# Patient Record
Sex: Female | Born: 1965 | Race: White | Hispanic: No | Smoking: Former smoker
Health system: Southern US, Community
[De-identification: ages and names within clinical notes are randomized; demographics above are authoritative.]

## PROBLEM LIST (undated history)

## (undated) DIAGNOSIS — K649 Unspecified hemorrhoids: Secondary | ICD-10-CM

## (undated) DIAGNOSIS — G43909 Migraine, unspecified, not intractable, without status migrainosus: Secondary | ICD-10-CM

## (undated) DIAGNOSIS — M1990 Unspecified osteoarthritis, unspecified site: Secondary | ICD-10-CM

## (undated) DIAGNOSIS — K219 Gastro-esophageal reflux disease without esophagitis: Secondary | ICD-10-CM

## (undated) DIAGNOSIS — F419 Anxiety disorder, unspecified: Secondary | ICD-10-CM

## (undated) DIAGNOSIS — E785 Hyperlipidemia, unspecified: Secondary | ICD-10-CM

## (undated) DIAGNOSIS — F99 Mental disorder, not otherwise specified: Secondary | ICD-10-CM

## (undated) DIAGNOSIS — I739 Peripheral vascular disease, unspecified: Secondary | ICD-10-CM

## (undated) DIAGNOSIS — Z8489 Family history of other specified conditions: Secondary | ICD-10-CM

## (undated) DIAGNOSIS — D649 Anemia, unspecified: Secondary | ICD-10-CM

## (undated) DIAGNOSIS — F329 Major depressive disorder, single episode, unspecified: Secondary | ICD-10-CM

## (undated) DIAGNOSIS — J189 Pneumonia, unspecified organism: Secondary | ICD-10-CM

## (undated) DIAGNOSIS — I82409 Acute embolism and thrombosis of unspecified deep veins of unspecified lower extremity: Secondary | ICD-10-CM

## (undated) DIAGNOSIS — J45909 Unspecified asthma, uncomplicated: Secondary | ICD-10-CM

## (undated) DIAGNOSIS — M797 Fibromyalgia: Secondary | ICD-10-CM

## (undated) DIAGNOSIS — F32A Depression, unspecified: Secondary | ICD-10-CM

## (undated) DIAGNOSIS — IMO0002 Reserved for concepts with insufficient information to code with codable children: Secondary | ICD-10-CM

## (undated) DIAGNOSIS — R7309 Other abnormal glucose: Secondary | ICD-10-CM

## (undated) DIAGNOSIS — M940 Chondrocostal junction syndrome [Tietze]: Secondary | ICD-10-CM

## (undated) DIAGNOSIS — Z8719 Personal history of other diseases of the digestive system: Secondary | ICD-10-CM

## (undated) DIAGNOSIS — R51 Headache: Secondary | ICD-10-CM

## (undated) DIAGNOSIS — R091 Pleurisy: Secondary | ICD-10-CM

## (undated) DIAGNOSIS — M329 Systemic lupus erythematosus, unspecified: Secondary | ICD-10-CM

## (undated) HISTORY — PX: CARPAL TUNNEL RELEASE: SHX101

## (undated) HISTORY — PX: APPENDECTOMY: SHX54

## (undated) HISTORY — PX: ABDOMINAL HYSTERECTOMY: SHX81

## (undated) HISTORY — PX: FOOT SURGERY: SHX648

## (undated) HISTORY — PX: BACK SURGERY: SHX140

## (undated) HISTORY — PX: CERVICAL SPINE SURGERY: SHX589

## (undated) HISTORY — PX: TONSILLECTOMY: SUR1361

## (undated) HISTORY — PX: CHOLECYSTECTOMY: SHX55

## (undated) HISTORY — PX: TUBAL LIGATION: SHX77

---

## 1999-06-29 ENCOUNTER — Other Ambulatory Visit: Admission: RE | Admit: 1999-06-29 | Discharge: 1999-07-20 | Payer: Self-pay | Admitting: Emergency Medicine

## 2009-04-13 ENCOUNTER — Ambulatory Visit (HOSPITAL_COMMUNITY): Admission: RE | Admit: 2009-04-13 | Discharge: 2009-04-13 | Payer: Self-pay | Admitting: Neurosurgery

## 2009-05-26 ENCOUNTER — Encounter: Admission: RE | Admit: 2009-05-26 | Discharge: 2009-05-26 | Payer: Self-pay | Admitting: Neurosurgery

## 2009-06-16 ENCOUNTER — Encounter: Admission: RE | Admit: 2009-06-16 | Discharge: 2009-06-16 | Payer: Self-pay | Admitting: Neurosurgery

## 2009-07-24 ENCOUNTER — Encounter: Admission: RE | Admit: 2009-07-24 | Discharge: 2009-07-24 | Payer: Self-pay | Admitting: Neurosurgery

## 2009-08-12 ENCOUNTER — Ambulatory Visit (HOSPITAL_COMMUNITY): Admission: RE | Admit: 2009-08-12 | Discharge: 2009-08-13 | Payer: Self-pay | Admitting: Neurosurgery

## 2009-09-15 ENCOUNTER — Encounter: Admission: RE | Admit: 2009-09-15 | Discharge: 2009-09-15 | Payer: Self-pay | Admitting: Neurosurgery

## 2010-05-23 DIAGNOSIS — I82409 Acute embolism and thrombosis of unspecified deep veins of unspecified lower extremity: Secondary | ICD-10-CM

## 2010-05-23 HISTORY — DX: Acute embolism and thrombosis of unspecified deep veins of unspecified lower extremity: I82.409

## 2010-06-10 ENCOUNTER — Encounter
Admission: RE | Admit: 2010-06-10 | Discharge: 2010-06-10 | Payer: Self-pay | Source: Home / Self Care | Attending: Neurosurgery | Admitting: Neurosurgery

## 2010-08-16 LAB — CBC
MCHC: 34.1 g/dL (ref 30.0–36.0)
MCV: 92.1 fL (ref 78.0–100.0)
Platelets: 216 10*3/uL (ref 150–400)
WBC: 8.2 10*3/uL (ref 4.0–10.5)

## 2010-08-16 LAB — BASIC METABOLIC PANEL
BUN: 9 mg/dL (ref 6–23)
Calcium: 9.1 mg/dL (ref 8.4–10.5)
Chloride: 103 mEq/L (ref 96–112)
Creatinine, Ser: 0.67 mg/dL (ref 0.4–1.2)
GFR calc non Af Amer: >60 mL/min (ref >60)
Glucose, Bld: 99 mg/dL (ref 70–99)

## 2010-08-16 LAB — SURGICAL PCR SCREEN
MRSA, PCR: NEGATIVE
Staphylococcus aureus: POSITIVE — AB

## 2010-08-16 LAB — DIFFERENTIAL
Basophils Absolute: 0 10*3/uL (ref 0.0–0.1)
Eosinophils Absolute: 0.2 10*3/uL (ref 0.0–0.7)
Lymphocytes Relative: 21 % (ref 12–46)

## 2010-08-25 LAB — BASIC METABOLIC PANEL
BUN: 15 mg/dL (ref 6–23)
CO2: 26 mEq/L (ref 19–32)
Calcium: 9.2 mg/dL (ref 8.4–10.5)
Creatinine, Ser: 0.67 mg/dL (ref 0.4–1.2)
Glucose, Bld: 86 mg/dL (ref 70–99)
Sodium: 139 mEq/L (ref 135–145)

## 2010-08-25 LAB — CBC
MCHC: 34.9 g/dL (ref 30.0–36.0)
Platelets: 194 10*3/uL (ref 150–400)
RDW: 12.9 % (ref 11.5–15.5)
WBC: 8.6 10*3/uL (ref 4.0–10.5)

## 2010-08-26 IMAGING — CR DG CERVICAL SPINE 1V
1 series · 1 of 1 positions shown · non-contrast
Comparison: Intraoperative film from [DATE]

CLINICAL DATA: ACDF 08/12/2009.  Now with left hand numbness.

CERVICAL SPINE - 1 VIEW

[w c-spine lat]
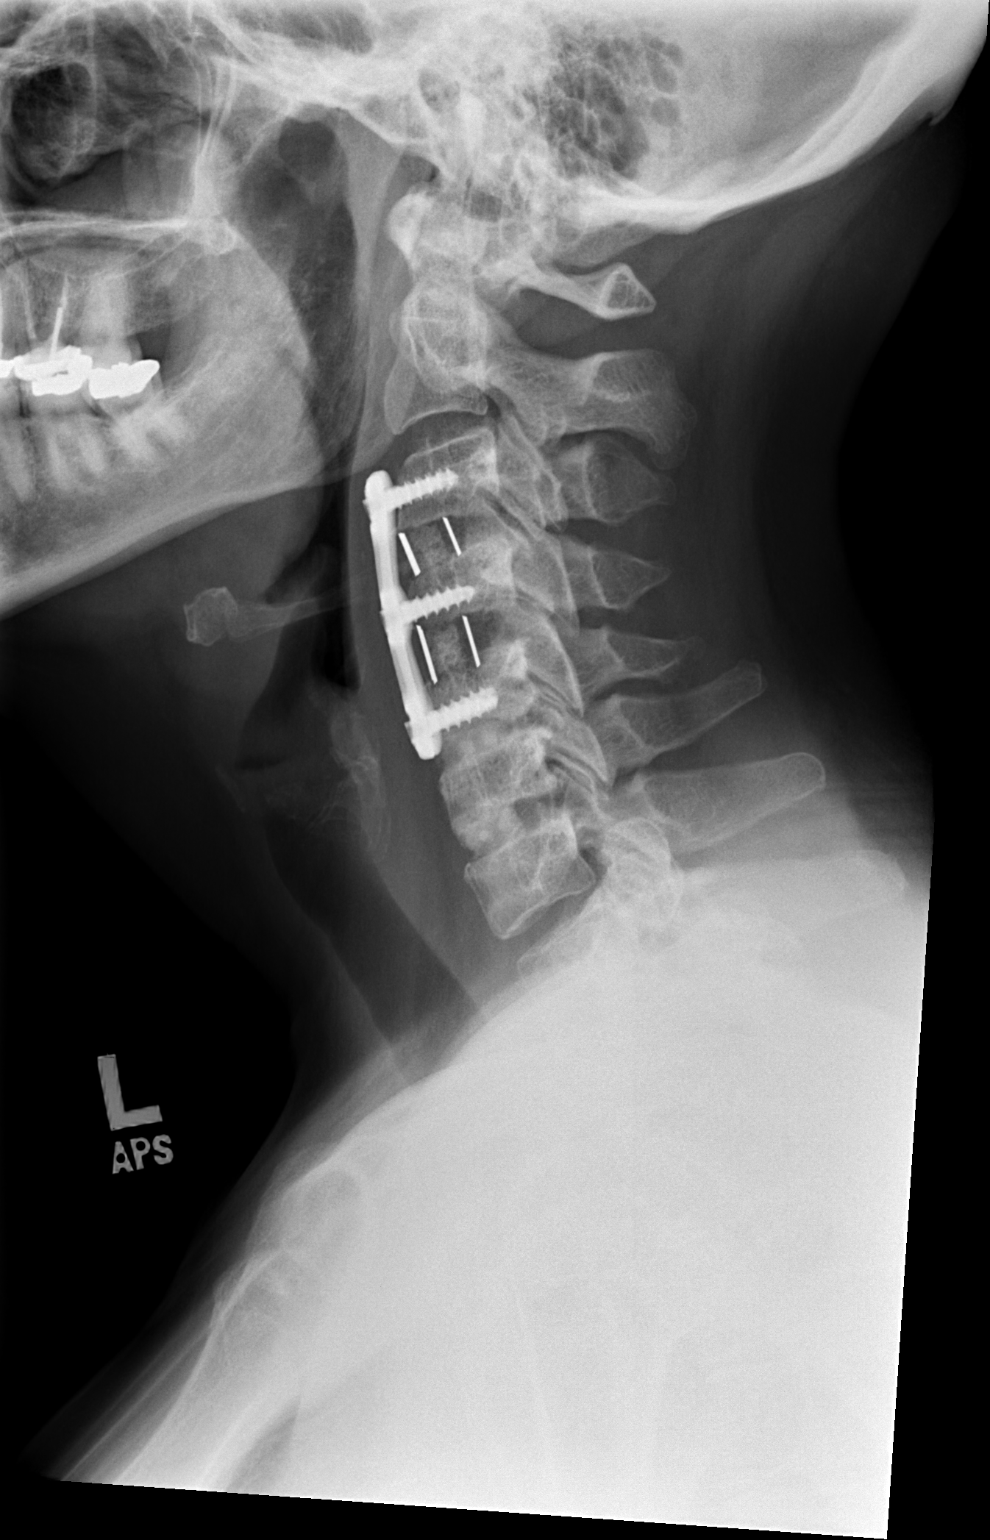

[1 of 1 positions shown; findings below may reference images not displayed]

FINDINGS: Satisfactory appearance status post C3-C5 ACDF with
anterior plating.  Unchanged position of the vertebral bodies and
interbody bone plugs.  Previous C5-6 and C6-7 fusions without
hardware.  Anatomic alignment.  No prevertebral soft tissue
swelling.
IMPRESSION: As above.  Unremarkable recent C3-C5 ACDF.

## 2013-05-27 ENCOUNTER — Other Ambulatory Visit: Payer: Self-pay | Admitting: Neurosurgery

## 2013-05-28 ENCOUNTER — Encounter (HOSPITAL_COMMUNITY): Payer: Self-pay

## 2013-05-28 ENCOUNTER — Encounter (HOSPITAL_COMMUNITY)
Admission: RE | Admit: 2013-05-28 | Discharge: 2013-05-28 | Disposition: A | Discharge status: Home / Self Care | Payer: BC Managed Care – PPO | Source: Ambulatory Visit | Attending: Neurosurgery | Admitting: Neurosurgery

## 2013-05-28 HISTORY — DX: Acute embolism and thrombosis of unspecified deep veins of unspecified lower extremity: I82.409

## 2013-05-28 HISTORY — DX: Anxiety disorder, unspecified: F41.9

## 2013-05-28 HISTORY — DX: Unspecified osteoarthritis, unspecified site: M19.90

## 2013-05-28 HISTORY — DX: Unspecified hemorrhoids: K64.9

## 2013-05-28 HISTORY — DX: Mental disorder, not otherwise specified: F99

## 2013-05-28 HISTORY — DX: Systemic lupus erythematosus, unspecified: M32.9

## 2013-05-28 HISTORY — DX: Family history of other specified conditions: Z84.89

## 2013-05-28 HISTORY — DX: Chondrocostal junction syndrome (tietze): M94.0

## 2013-05-28 HISTORY — DX: Reserved for concepts with insufficient information to code with codable children: IMO0002

## 2013-05-28 HISTORY — DX: Gastro-esophageal reflux disease without esophagitis: K21.9

## 2013-05-28 HISTORY — DX: Hyperlipidemia, unspecified: E78.5

## 2013-05-28 HISTORY — DX: Peripheral vascular disease, unspecified: I73.9

## 2013-05-28 HISTORY — DX: Unspecified asthma, uncomplicated: J45.909

## 2013-05-28 HISTORY — DX: Personal history of other diseases of the digestive system: Z87.19

## 2013-05-28 HISTORY — DX: Fibromyalgia: M79.7

## 2013-05-28 HISTORY — DX: Pleurisy: R09.1

## 2013-05-28 LAB — CBC
HCT: 41.5 % (ref 36.0–46.0)
Hemoglobin: 14 g/dL (ref 12.0–15.0)
MCH: 29.2 pg (ref 26.0–34.0)
MCHC: 33.7 g/dL (ref 30.0–36.0)
MCV: 86.6 fL (ref 78.0–100.0)
PLATELETS: 313 10*3/uL (ref 150–400)
RBC: 4.79 MIL/uL (ref 3.87–5.11)
RDW: 14 % (ref 11.5–15.5)
WBC: 9.5 10*3/uL (ref 4.0–10.5)

## 2013-05-28 LAB — BASIC METABOLIC PANEL
BUN: 7 mg/dL (ref 6–23)
CO2: 26 mEq/L (ref 19–32)
Calcium: 9.4 mg/dL (ref 8.4–10.5)
Chloride: 101 mEq/L (ref 96–112)
Creatinine, Ser: 0.61 mg/dL (ref 0.50–1.10)
GFR calc Af Amer: >90 mL/min (ref >90)
Glucose, Bld: 153 mg/dL — ABNORMAL HIGH (ref 70–99)
POTASSIUM: 3.6 meq/L — AB (ref 3.7–5.3)
SODIUM: 143 meq/L (ref 137–147)

## 2013-05-28 LAB — SURGICAL PCR SCREEN
MRSA, PCR: NEGATIVE
Staphylococcus aureus: POSITIVE — AB

## 2013-05-28 NOTE — Pre-Procedure Instructions (Addendum)
Doristine BosworthMichelle Goldston  05/28/2013   Your procedure is scheduled on: Wednesday, Januray 7.  Report [facility], Main Entrance/Entrance "A" at 6:30 AM.  Call this number if you have problems the morning of surgery: [phone]   Remember:   Do not eat food or drink liquids after midnight.   Take these medicines the morning of surgery with A SIP OF WATER: -Nexium, Cymbalta, Medrol. Take Xanax, Dilaudid, Soma if needed.    Do not wear jewelry, make-up or nail polish.  Do not wear lotions, powders, or perfumes. You may wear deodorant.  Do not shave 48 hours prior to surgery.   Do not bring valuables to the hospital.  [facility] is not responsible for any belongings or valuables.               Contacts, dentures or bridgework may not be worn into surgery.  Leave suitcase in the car. After surgery it may be brought to your room.  For patients admitted to the hospital, discharge time is determined by your treatment team.               Patients discharged the day of surgery will not be allowed to drive home.  Name and phone number of your driver: -   Special Instructions: Shower with CHG wash (Bactoshield) tonight and again in the am prior to arriving to hospital.   Please read over the following fact sheets that you were given: Pain Booklet, Coughing and Deep Breathing and Surgical Site Infection Prevention

## 2013-05-28 NOTE — Progress Notes (Signed)
Anesthesia Note:  RN notified me and anesthesiologist Dr. Jean RosenthalJackson about tachycardia.  Patient was having a lot of back and leg pain (crying), but no chest pain or SOB.  She reported a normal stress test at [facility] last year.  History, EKG, vitals reviewed by Dr. Jean RosenthalJackson and myself.  Patient is a first case tomorrow.  She will be evaluated by her assigned anesthesiologist on the day of surgery.  PAT RN to follow-up results from today and notify late anesthesiologist if any significant abnormalities.  HPR to fax copy of stress test before tomorrow morning.  Velna Ochsllison Jerrel Tiberio, PA-C [facility]/Anesthesiology Phone [phone] 05/28/2013 4:55 PM

## 2013-05-29 ENCOUNTER — Encounter (HOSPITAL_COMMUNITY): Payer: Self-pay | Admitting: Anesthesiology

## 2013-05-29 ENCOUNTER — Encounter (HOSPITAL_COMMUNITY)
Admission: RE | Disposition: A | Discharge status: Home / Self Care | Payer: Self-pay | Source: Ambulatory Visit | Attending: Neurosurgery

## 2013-05-29 ENCOUNTER — Ambulatory Visit (HOSPITAL_COMMUNITY): Payer: BC Managed Care – PPO | Admitting: Anesthesiology

## 2013-05-29 ENCOUNTER — Ambulatory Visit (HOSPITAL_COMMUNITY)
Admission: RE | Admit: 2013-05-29 | Discharge: 2013-05-29 | Disposition: A | Discharge status: Home / Self Care | Payer: BC Managed Care – PPO | Source: Ambulatory Visit | Attending: Neurosurgery | Admitting: Neurosurgery

## 2013-05-29 ENCOUNTER — Ambulatory Visit (HOSPITAL_COMMUNITY): Payer: BC Managed Care – PPO

## 2013-05-29 ENCOUNTER — Encounter (HOSPITAL_COMMUNITY): Payer: BC Managed Care – PPO | Admitting: Vascular Surgery

## 2013-05-29 DIAGNOSIS — I739 Peripheral vascular disease, unspecified: Secondary | ICD-10-CM | POA: Insufficient documentation

## 2013-05-29 DIAGNOSIS — Z01810 Encounter for preprocedural cardiovascular examination: Secondary | ICD-10-CM | POA: Insufficient documentation

## 2013-05-29 DIAGNOSIS — K219 Gastro-esophageal reflux disease without esophagitis: Secondary | ICD-10-CM | POA: Insufficient documentation

## 2013-05-29 DIAGNOSIS — J45909 Unspecified asthma, uncomplicated: Secondary | ICD-10-CM | POA: Insufficient documentation

## 2013-05-29 DIAGNOSIS — M940 Chondrocostal junction syndrome [Tietze]: Secondary | ICD-10-CM | POA: Insufficient documentation

## 2013-05-29 DIAGNOSIS — E785 Hyperlipidemia, unspecified: Secondary | ICD-10-CM | POA: Insufficient documentation

## 2013-05-29 DIAGNOSIS — F411 Generalized anxiety disorder: Secondary | ICD-10-CM | POA: Insufficient documentation

## 2013-05-29 DIAGNOSIS — M329 Systemic lupus erythematosus, unspecified: Secondary | ICD-10-CM | POA: Insufficient documentation

## 2013-05-29 DIAGNOSIS — Z86718 Personal history of other venous thrombosis and embolism: Secondary | ICD-10-CM | POA: Insufficient documentation

## 2013-05-29 DIAGNOSIS — M5126 Other intervertebral disc displacement, lumbar region: Secondary | ICD-10-CM | POA: Diagnosis present

## 2013-05-29 DIAGNOSIS — Z01812 Encounter for preprocedural laboratory examination: Secondary | ICD-10-CM | POA: Insufficient documentation

## 2013-05-29 DIAGNOSIS — Z87891 Personal history of nicotine dependence: Secondary | ICD-10-CM | POA: Insufficient documentation

## 2013-05-29 DIAGNOSIS — M129 Arthropathy, unspecified: Secondary | ICD-10-CM | POA: Insufficient documentation

## 2013-05-29 DIAGNOSIS — IMO0001 Reserved for inherently not codable concepts without codable children: Secondary | ICD-10-CM | POA: Insufficient documentation

## 2013-05-29 HISTORY — PX: LUMBAR LAMINECTOMY/DECOMPRESSION MICRODISCECTOMY: SHX5026

## 2013-05-29 SURGERY — LUMBAR LAMINECTOMY/DECOMPRESSION MICRODISCECTOMY 1 LEVEL
Anesthesia: General | Site: Back | Laterality: Right

## 2013-05-29 MED ORDER — SODIUM CHLORIDE 0.9 % IV SOLN: 250.0000 mL | INTRAVENOUS | Status: DC

## 2013-05-29 MED ORDER — PHENYLEPHRINE HCL 10 MG/ML IJ SOLN
INTRAMUSCULAR | Status: DC | PRN
  Administered 2013-05-29 (×2): 80 ug via INTRAVENOUS

## 2013-05-29 MED ORDER — ONDANSETRON HCL 4 MG PO TABS: 8.0000 mg | ORAL_TABLET | Freq: Three times a day (TID) | ORAL | Status: DC | PRN

## 2013-05-29 MED ORDER — MUPIROCIN 2 % EX OINT
TOPICAL_OINTMENT | Freq: Two times a day (BID) | CUTANEOUS | Status: DC
  Administered 2013-05-29: 08:00:00 via NASAL
  Filled 2013-05-29: qty 22

## 2013-05-29 MED ORDER — FENTANYL CITRATE 0.05 MG/ML IJ SOLN
INTRAMUSCULAR | Status: DC | PRN
  Administered 2013-05-29 (×6): 50 ug via INTRAVENOUS
  Administered 2013-05-29: 100 ug via INTRAVENOUS
  Administered 2013-05-29 (×2): 50 ug via INTRAVENOUS

## 2013-05-29 MED ORDER — ONDANSETRON HCL 4 MG/2ML IJ SOLN: 4.0000 mg | Freq: Once | INTRAMUSCULAR | Status: DC | PRN

## 2013-05-29 MED ORDER — HYDROMORPHONE HCL PF 1 MG/ML IJ SOLN
INTRAMUSCULAR | Status: AC
  Filled 2013-05-29: qty 1

## 2013-05-29 MED ORDER — ATORVASTATIN CALCIUM 10 MG PO TABS
10.0000 mg | ORAL_TABLET | Freq: Every day | ORAL | Status: DC
  Administered 2013-05-29: 10 mg via ORAL
  Filled 2013-05-29: qty 1

## 2013-05-29 MED ORDER — SODIUM CHLORIDE 0.9 % IJ SOLN
3.0000 mL | INTRAMUSCULAR | Status: DC | PRN
Start: 2013-05-29 — End: 2013-05-29

## 2013-05-29 MED ORDER — VANCOMYCIN HCL IN DEXTROSE 1-5 GM/200ML-% IV SOLN
1000.0000 mg | Freq: Once | INTRAVENOUS | Status: DC
  Filled 2013-05-29: qty 200

## 2013-05-29 MED ORDER — VANCOMYCIN HCL IN DEXTROSE 1-5 GM/200ML-% IV SOLN
INTRAVENOUS | Status: AC
  Administered 2013-05-29: 1000 mg via INTRAVENOUS
  Filled 2013-05-29: qty 200

## 2013-05-29 MED ORDER — HYDROMORPHONE HCL 2 MG PO TABS
4.0000 mg | ORAL_TABLET | ORAL | Status: DC | PRN
  Administered 2013-05-29: 4 mg via ORAL
  Filled 2013-05-29: qty 2

## 2013-05-29 MED ORDER — HYDROMORPHONE HCL PF 1 MG/ML IJ SOLN: 0.5000 mg | INTRAMUSCULAR | Status: DC | PRN

## 2013-05-29 MED ORDER — LACTATED RINGERS IV SOLN
INTRAVENOUS | Status: DC | PRN
  Administered 2013-05-29 (×2): via INTRAVENOUS

## 2013-05-29 MED ORDER — ALUM & MAG HYDROXIDE-SIMETH 200-200-20 MG/5ML PO SUSP: 30.0000 mL | Freq: Four times a day (QID) | ORAL | Status: DC | PRN

## 2013-05-29 MED ORDER — LIDOCAINE-EPINEPHRINE 1 %-1:100000 IJ SOLN
INTRAMUSCULAR | Status: DC | PRN
  Administered 2013-05-29: 10 mL

## 2013-05-29 MED ORDER — ALPRAZOLAM 0.5 MG PO TABS: 0.5000 mg | ORAL_TABLET | Freq: Two times a day (BID) | ORAL | Status: DC | PRN

## 2013-05-29 MED ORDER — 0.9 % SODIUM CHLORIDE (POUR BTL) OPTIME
TOPICAL | Status: DC | PRN
  Administered 2013-05-29: 1000 mL

## 2013-05-29 MED ORDER — HYDROMORPHONE HCL PF 1 MG/ML IJ SOLN
INTRAMUSCULAR | Status: AC
  Filled 2013-05-29: qty 2

## 2013-05-29 MED ORDER — ONDANSETRON HCL 4 MG/2ML IJ SOLN
INTRAMUSCULAR | Status: DC | PRN
Start: 2013-05-29 — End: 2013-05-29
  Administered 2013-05-29: 4 mg via INTRAVENOUS

## 2013-05-29 MED ORDER — ROCURONIUM BROMIDE 100 MG/10ML IV SOLN
INTRAVENOUS | Status: DC | PRN
  Administered 2013-05-29: 5 mg via INTRAVENOUS
  Administered 2013-05-29: 40 mg via INTRAVENOUS

## 2013-05-29 MED ORDER — ACETAMINOPHEN 650 MG RE SUPP: 650.0000 mg | RECTAL | Status: DC | PRN

## 2013-05-29 MED ORDER — MENTHOL 3 MG MT LOZG: 1.0000 | LOZENGE | OROMUCOSAL | Status: DC | PRN

## 2013-05-29 MED ORDER — PANTOPRAZOLE SODIUM 40 MG PO TBEC: 40.0000 mg | DELAYED_RELEASE_TABLET | Freq: Every day | ORAL | Status: DC

## 2013-05-29 MED ORDER — NEOSTIGMINE METHYLSULFATE 1 MG/ML IJ SOLN
INTRAMUSCULAR | Status: DC | PRN
  Administered 2013-05-29: 3 mg via INTRAVENOUS

## 2013-05-29 MED ORDER — OXYCODONE HCL 10 MG PO TABS: 10.0000 mg | ORAL_TABLET | ORAL | Status: DC | PRN

## 2013-05-29 MED ORDER — SODIUM CHLORIDE 0.9 % IJ SOLN
3.0000 mL | Freq: Two times a day (BID) | INTRAMUSCULAR | Status: DC
  Administered 2013-05-29: 3 mL via INTRAVENOUS

## 2013-05-29 MED ORDER — HYDROXYCHLOROQUINE SULFATE 200 MG PO TABS
200.0000 mg | ORAL_TABLET | Freq: Two times a day (BID) | ORAL | Status: DC
  Administered 2013-05-29: 200 mg via ORAL
  Filled 2013-05-29 (×2): qty 1

## 2013-05-29 MED ORDER — ACETAMINOPHEN 325 MG PO TABS
650.0000 mg | ORAL_TABLET | ORAL | Status: DC | PRN
Start: 2013-05-29 — End: 2013-05-29

## 2013-05-29 MED ORDER — SODIUM CHLORIDE 0.9 % IR SOLN
Status: DC | PRN
  Administered 2013-05-29: 09:00:00

## 2013-05-29 MED ORDER — PROPOFOL 10 MG/ML IV BOLUS
INTRAVENOUS | Status: DC | PRN
  Administered 2013-05-29: 200 mg via INTRAVENOUS

## 2013-05-29 MED ORDER — MIDAZOLAM HCL 5 MG/5ML IJ SOLN
INTRAMUSCULAR | Status: DC | PRN
  Administered 2013-05-29: 1 mg via INTRAVENOUS

## 2013-05-29 MED ORDER — DOCUSATE SODIUM 100 MG PO CAPS
100.0000 mg | ORAL_CAPSULE | Freq: Two times a day (BID) | ORAL | Status: DC
  Administered 2013-05-29: 100 mg via ORAL
  Filled 2013-05-29 (×2): qty 1

## 2013-05-29 MED ORDER — PENTOXIFYLLINE ER 400 MG PO TBCR
400.0000 mg | EXTENDED_RELEASE_TABLET | Freq: Two times a day (BID) | ORAL | Status: DC
  Administered 2013-05-29: 400 mg via ORAL
  Filled 2013-05-29 (×2): qty 1

## 2013-05-29 MED ORDER — MIDAZOLAM HCL 2 MG/2ML IJ SOLN
INTRAMUSCULAR | Status: AC
  Filled 2013-05-29: qty 2

## 2013-05-29 MED ORDER — THROMBIN 5000 UNITS EX SOLR
CUTANEOUS | Status: DC | PRN
  Administered 2013-05-29 (×2): 5000 [IU] via TOPICAL

## 2013-05-29 MED ORDER — MIDAZOLAM HCL 2 MG/2ML IJ SOLN
1.0000 mg | Freq: Once | INTRAMUSCULAR | Status: AC
  Administered 2013-05-29: 1 mg via INTRAVENOUS

## 2013-05-29 MED ORDER — LIDOCAINE HCL (CARDIAC) 20 MG/ML IV SOLN
INTRAVENOUS | Status: DC | PRN
  Administered 2013-05-29: 60 mg via INTRAVENOUS

## 2013-05-29 MED ORDER — COLCHICINE 0.6 MG PO TABS
0.6000 mg | ORAL_TABLET | Freq: Every day | ORAL | Status: DC
  Administered 2013-05-29: 0.6 mg via ORAL
  Filled 2013-05-29: qty 1

## 2013-05-29 MED ORDER — GLYCOPYRROLATE 0.2 MG/ML IJ SOLN
INTRAMUSCULAR | Status: DC | PRN
  Administered 2013-05-29: 0.4 mg via INTRAVENOUS

## 2013-05-29 MED ORDER — HYDROMORPHONE HCL PF 1 MG/ML IJ SOLN
0.2500 mg | INTRAMUSCULAR | Status: DC | PRN
  Administered 2013-05-29 (×4): 0.5 mg via INTRAVENOUS

## 2013-05-29 MED ORDER — PROPRANOLOL HCL 1 MG/ML IV SOLN
INTRAVENOUS | Status: DC | PRN
  Administered 2013-05-29: .25 mg via INTRAVENOUS

## 2013-05-29 MED ORDER — CYCLOBENZAPRINE HCL 10 MG PO TABS
10.0000 mg | ORAL_TABLET | Freq: Three times a day (TID) | ORAL | Status: DC | PRN
  Administered 2013-05-29: 10 mg via ORAL
  Filled 2013-05-29: qty 1

## 2013-05-29 MED ORDER — DULOXETINE HCL 60 MG PO CPEP
60.0000 mg | ORAL_CAPSULE | Freq: Two times a day (BID) | ORAL | Status: DC
Start: 2013-05-29 — End: 2013-05-29
  Administered 2013-05-29: 60 mg via ORAL
  Filled 2013-05-29 (×2): qty 1

## 2013-05-29 MED ORDER — CYCLOBENZAPRINE HCL 10 MG PO TABS
ORAL_TABLET | ORAL | Status: AC
  Filled 2013-05-29: qty 1

## 2013-05-29 MED ORDER — PREDNISONE 5 MG PO TABS
5.0000 mg | ORAL_TABLET | Freq: Every day | ORAL | Status: DC
  Filled 2013-05-29: qty 1

## 2013-05-29 MED ORDER — BUPIVACAINE HCL (PF) 0.25 % IJ SOLN
INTRAMUSCULAR | Status: DC | PRN
  Administered 2013-05-29: 10 mL

## 2013-05-29 MED ORDER — ONDANSETRON HCL 4 MG/2ML IJ SOLN: 4.0000 mg | INTRAMUSCULAR | Status: DC | PRN

## 2013-05-29 MED ORDER — CARISOPRODOL 350 MG PO TABS
350.0000 mg | ORAL_TABLET | Freq: Four times a day (QID) | ORAL | Status: DC
  Administered 2013-05-29: 350 mg via ORAL
  Filled 2013-05-29: qty 1

## 2013-05-29 MED ORDER — MUPIROCIN 2 % EX OINT
TOPICAL_OINTMENT | CUTANEOUS | Status: AC
  Filled 2013-05-29: qty 22

## 2013-05-29 MED ORDER — PHENOL 1.4 % MT LIQD: 1.0000 | OROMUCOSAL | Status: DC | PRN

## 2013-05-29 MED ORDER — ARTIFICIAL TEARS OP OINT
TOPICAL_OINTMENT | OPHTHALMIC | Status: DC | PRN
  Administered 2013-05-29: 1 via OPHTHALMIC

## 2013-05-29 MED ORDER — HEMOSTATIC AGENTS (NO CHARGE) OPTIME
TOPICAL | Status: DC | PRN
  Administered 2013-05-29: 1 via TOPICAL

## 2013-05-29 SURGICAL SUPPLY — 58 items
BAG DECANTER FOR FLEXI CONT (MISCELLANEOUS) ×2 IMPLANT
BENZOIN TINCTURE PRP APPL 2/3 (GAUZE/BANDAGES/DRESSINGS) ×2 IMPLANT
BLADE SURG 11 STRL SS (BLADE) ×2 IMPLANT
BLADE SURG ROTATE 9660 (MISCELLANEOUS) IMPLANT
BRUSH SCRUB EZ PLAIN DRY (MISCELLANEOUS) ×2 IMPLANT
BUR MATCHSTICK NEURO 3.0 LAGG (BURR) ×2 IMPLANT
BUR PRECISION FLUTE 6.0 (BURR) ×2 IMPLANT
CANISTER SUCT 3000ML (MISCELLANEOUS) ×2 IMPLANT
CONT SPEC 4OZ CLIKSEAL STRL BL (MISCELLANEOUS) ×2 IMPLANT
DECANTER SPIKE VIAL GLASS SM (MISCELLANEOUS) ×2 IMPLANT
DERMABOND ADHESIVE PROPEN (GAUZE/BANDAGES/DRESSINGS) ×1
DERMABOND ADVANCED (GAUZE/BANDAGES/DRESSINGS) ×1
DERMABOND ADVANCED .7 DNX12 (GAUZE/BANDAGES/DRESSINGS) ×1 IMPLANT
DERMABOND ADVANCED .7 DNX6 (GAUZE/BANDAGES/DRESSINGS) ×1 IMPLANT
DRAPE LAPAROTOMY 100X72X124 (DRAPES) ×2 IMPLANT
DRAPE MICROSCOPE ZEISS OPMI (DRAPES) ×2 IMPLANT
DRAPE POUCH INSTRU U-SHP 10X18 (DRAPES) ×2 IMPLANT
DRAPE PROXIMA HALF (DRAPES) IMPLANT
DRAPE SURG 17X23 STRL (DRAPES) ×2 IMPLANT
DRSG OPSITE 4X5.5 SM (GAUZE/BANDAGES/DRESSINGS) ×2 IMPLANT
DRSG OPSITE POSTOP 3X4 (GAUZE/BANDAGES/DRESSINGS) ×2 IMPLANT
DURAPREP 26ML APPLICATOR (WOUND CARE) ×2 IMPLANT
ELECT BLADE 4.0 EZ CLEAN MEGAD (MISCELLANEOUS) ×2
ELECT REM PT RETURN 9FT ADLT (ELECTROSURGICAL) ×2
ELECTRODE BLDE 4.0 EZ CLN MEGD (MISCELLANEOUS) ×1 IMPLANT
ELECTRODE REM PT RTRN 9FT ADLT (ELECTROSURGICAL) ×1 IMPLANT
GAUZE SPONGE 4X4 16PLY XRAY LF (GAUZE/BANDAGES/DRESSINGS) IMPLANT
GLOVE BIO SURGEON STRL SZ8 (GLOVE) ×2 IMPLANT
GLOVE BIOGEL PI IND STRL 7.5 (GLOVE) ×4 IMPLANT
GLOVE BIOGEL PI INDICATOR 7.5 (GLOVE) ×4
GLOVE EXAM NITRILE LRG STRL (GLOVE) IMPLANT
GLOVE EXAM NITRILE MD LF STRL (GLOVE) IMPLANT
GLOVE EXAM NITRILE XL STR (GLOVE) IMPLANT
GLOVE EXAM NITRILE XS STR PU (GLOVE) IMPLANT
GLOVE INDICATOR 8.5 STRL (GLOVE) ×2 IMPLANT
GLOVE SURG SS PI 7.5 STRL IVOR (GLOVE) ×8 IMPLANT
GOWN BRE IMP SLV AUR LG STRL (GOWN DISPOSABLE) ×2 IMPLANT
GOWN BRE IMP SLV AUR XL STRL (GOWN DISPOSABLE) ×4 IMPLANT
GOWN STRL REIN 2XL LVL4 (GOWN DISPOSABLE) IMPLANT
KIT BASIN OR (CUSTOM PROCEDURE TRAY) ×2 IMPLANT
KIT ROOM TURNOVER OR (KITS) ×2 IMPLANT
NEEDLE HYPO 22GX1.5 SAFETY (NEEDLE) ×2 IMPLANT
NEEDLE SPNL 22GX3.5 QUINCKE BK (NEEDLE) ×2 IMPLANT
NS IRRIG 1000ML POUR BTL (IV SOLUTION) ×2 IMPLANT
PACK LAMINECTOMY NEURO (CUSTOM PROCEDURE TRAY) ×2 IMPLANT
RUBBERBAND STERILE (MISCELLANEOUS) ×4 IMPLANT
SPONGE GAUZE 4X4 12PLY (GAUZE/BANDAGES/DRESSINGS) ×2 IMPLANT
SPONGE SURGIFOAM ABS GEL SZ50 (HEMOSTASIS) ×2 IMPLANT
STRIP CLOSURE SKIN 1/2X4 (GAUZE/BANDAGES/DRESSINGS) ×2 IMPLANT
SUT VIC AB 0 CT1 18XCR BRD8 (SUTURE) ×1 IMPLANT
SUT VIC AB 0 CT1 8-18 (SUTURE) ×1
SUT VIC AB 2-0 CT1 18 (SUTURE) ×2 IMPLANT
SUT VICRYL 4-0 PS2 18IN ABS (SUTURE) ×2 IMPLANT
SYR 20ML ECCENTRIC (SYRINGE) ×2 IMPLANT
TAPE STRIPS DRAPE STRL (GAUZE/BANDAGES/DRESSINGS) ×2 IMPLANT
TOWEL OR 17X24 6PK STRL BLUE (TOWEL DISPOSABLE) ×2 IMPLANT
TOWEL OR 17X26 10 PK STRL BLUE (TOWEL DISPOSABLE) ×2 IMPLANT
WATER STERILE IRR 1000ML POUR (IV SOLUTION) ×2 IMPLANT

## 2013-05-29 NOTE — Op Note (Signed)
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  EBL: Minimal  History of present illness: Patient is a very pleasant 48 year old him as a progress worsening back and right leg pain rating down posterior thigh calf outside of Monsel consistent with an S1 nerve root pattern workup revealed a very large recurrent disc herniation L5-S1 the right patient failed all forms of conservative treatment with steroids and narcotics and time and due to patient's failure conservative treatment imaging findings and progression of clinical syndrome I recommended laminectomy redo in my discectomy L5-S1 on the right I extensively reviewed the risks and benefits of the operation the patient as well as therapy course expectations about alternatives surgery and she understood and agreed to proceed forward.  Operative procedure: Patient brought into the or was induced and general anesthesia positioned prone on the Wilson frame her back was prepped and draped in routine sterile fashion her old incision was outlined and infiltrated with 10 cc lidocaine appendectomy a bullet coverage down the subcutaneous tissues subperiosteal dissections care lamina of and through the scar and L5-S1 the right interoperative x-ray confirmed an of case appropriate level so the plane to the scar tissue and residual lamina lamina of L5 medial facet complex and S1 was developed and the laminotomy was extended under biting of the medial facet complex inferior L5 superior S1 a lot of dictation of the dura and the S1 nerve root the microscope illumination the S1 nerve root was dissected off of the pedicle the S1 pedicles identify the margins superiorly to the scar tissue the lateral aspect  the disc spaces identified there was extensive and very large osteophyte coming off the inferior aspect of the medial facet complex tented against a ruptured disc herniation causing severe entrapment of the S1 nerve root. The spur was aggressively under been the disc spaces identified and epidural veins coagulated an annulotomy was made with lumbar scalpel several large free fragments were medially expressed and removed with pituitary rongeurs the nerve hook then disc space was entered and cleaned out with Epstein curettes pituitary rongeurs and a 2 mm Kerrison punch. At the and of the discectomy there was no further stenosis on thecal sac right S1 nerve root the foramen easily accepted a coronary.and hockey-stick the wounds and copiously irrigated meticulous hemostasis was maintained Gelfoam was overlaid top of the dura the muscle fascia approximately is with interrupted Vicryl and the skin was closed running 4 subcuticular benzoin and Steri-Strips were applied patient recovered in stable condition. At the end of case on it counts sponge counts were correct.

## 2013-05-29 NOTE — Transfer of Care (Signed)
Immediate Anesthesia Transfer of Care Note  Patient: Dana Bond  Procedure(s) Performed: Procedure(s) with comments: LUMBAR LAMINECTOMY/DECOMPRESSION MICRODISCECTOMY 1 LEVEL (Right) - LUMBAR LAMINECTOMY/DECOMPRESSION MICRODISCECTOMY 1 LEVEL  Patient Location: PACU  Anesthesia Type:General  Level of Consciousness: awake, alert  and oriented  Airway & Oxygen Therapy: Patient Spontanous Breathing and Patient connected to face mask oxygen  Post-op Assessment: Report given to PACU RN  Post vital signs: Reviewed and stable  Complications: No apparent anesthesia complications

## 2013-05-29 NOTE — Discharge Instructions (Signed)

## 2013-05-29 NOTE — Progress Notes (Signed)
Dr.Smith aware of pain with 4 of dilaudid , and versed 1 mg ordered

## 2013-05-29 NOTE — Progress Notes (Signed)
Spoke with Dr Katrinka BlazingSMith about pt still coing of pain, more dilaudid ordered

## 2013-05-29 NOTE — Plan of Care (Signed)
Problem: Consults Goal: Diagnosis - Spinal Surgery Outcome: Completed/Met Date Met:  05/29/13 Lumbar Laminectomy (Complex)

## 2013-05-29 NOTE — Preoperative (Signed)
Beta Blockers   Reason not to administer Beta Blockers:Not Applicable 

## 2013-05-29 NOTE — H&P (Signed)
Dana Bond is an 48 y.o. female.   Chief Complaint: Back and right leg pain HPI: Patient is a very pleasant 48 year old female is a progress worsening back and right lower 70 pain rating down the back of her leg back or calf in the outside bottom of foot consistent with an S1 nerve root pattern. This pain is been relenting is been refractory to 2 doses of steroids Dosepaks patient got and grossly worsen progressive more debilitated workup revealed a large recurrent disc herniation L5-S1 on the right I have extensively reviewed the risks and benefits of the operation with the patient as well as perioperative course expectations about alternatives surgery and she understood and agreed to proceed forward.  Past Medical History  Diagnosis Date  . Lupus   . Hyperlipemia   . Family history of anesthesia complication     Mother N/V  . Peripheral vascular disease     on Plental  . Anxiety   . Mental disorder   . Asthma     been a while  . GERD (gastroesophageal reflux disease)   . Hemorrhoid   . H/O hiatal hernia   . Fibromyalgia   . Arthritis   . DVT (deep venous thrombosis) 2012    post op  . Pleurisy   . Costochondritis     Past Surgical History  Procedure Laterality Date  . Abdominal hysterectomy      Partial  . Cholecystectomy    . Tonsillectomy      and adneodis  . Appendectomy    . Cervical spine surgery      ACDF x 2  . Back surgery      lower disectomy  . Foot surgery Right     nerve removal  . Carpal tunnel release Right     History reviewed. No pertinent family history. Social History:  reports that she has quit smoking. She does not have any smokeless tobacco history on file. She reports that she does not drink alcohol or use illicit drugs.  Allergies:  Allergies  Allergen Reactions  . Augmentin [Amoxicillin-Pot Clavulanate] Anaphylaxis  . Ciprofloxacin Anaphylaxis  . Lyrica [Pregabalin] Anaphylaxis  . Gabapentin Swelling  . Imuran [Azathioprine] Rash     Medications Prior to Admission  Medication Sig Dispense Refill  . ALPRAZolam (XANAX) 0.5 MG tablet Take 0.5 mg by mouth 2 (two) times daily as needed for anxiety.      Marland Kitchen atorvastatin (LIPITOR) 10 MG tablet Take 10 mg by mouth daily.      . carisoprodol (SOMA) 350 MG tablet Take 350 mg by mouth 3 (three) times daily as needed for muscle spasms.      . colchicine 0.6 MG tablet Take 0.6 mg by mouth daily.      . DULoxetine (CYMBALTA) 60 MG capsule Take 60 mg by mouth 2 (two) times daily.      Marland Kitchen esomeprazole (NEXIUM) 40 MG capsule Take 40 mg by mouth 2 (two) times daily before a meal.      . HYDROmorphone (DILAUDID) 4 MG tablet Take 4 mg by mouth every 4 (four) hours as needed for severe pain.      . hydroxychloroquine (PLAQUENIL) 200 MG tablet Take 200 mg by mouth 2 (two) times daily.      . methotrexate (50 MG/ML) 1 G injection Inject into the vein once.      . methylPREDNISolone (MEDROL DOSEPAK) 4 MG tablet Take 4 mg by mouth. follow package directions      .  ondansetron (ZOFRAN) 8 MG tablet Take 8 mg by mouth every 8 (eight) hours as needed for nausea or vomiting.      . pentoxifylline (TRENTAL) 400 MG CR tablet Take 400 mg by mouth 2 (two) times daily.      . predniSONE (DELTASONE) 5 MG tablet Take 5 mg by mouth daily with breakfast.        Results for orders placed during the hospital encounter of 05/28/13 (from the past 48 hour(s))  SURGICAL PCR SCREEN     Status: Abnormal   Collection Time    05/28/13  4:32 PM      Result Value Range   MRSA, PCR NEGATIVE  NEGATIVE   Staphylococcus aureus POSITIVE (*) NEGATIVE   Comment:            The Xpert SA Assay (FDA     approved for NASAL specimens     in patients over 15 years of age),     is one component of     a comprehensive surveillance     program.  Test performance has     been validated by [employer] for patients greater     than or equal to 32 year old.     It is not intended     to diagnose infection nor to     guide  or monitor treatment.  BASIC METABOLIC PANEL     Status: Abnormal   Collection Time    05/28/13  4:34 PM      Result Value Range   Sodium 143  137 - 147 mEq/L   Potassium 3.6 (*) 3.7 - 5.3 mEq/L   Chloride 101  96 - 112 mEq/L   CO2 26  19 - 32 mEq/L   Glucose, Bld 153 (*) 70 - 99 mg/dL   BUN 7  6 - 23 mg/dL   Creatinine, Ser 0.61  0.50 - 1.10 mg/dL   Calcium 9.4  8.4 - 10.5 mg/dL   GFR calc non Af Amer >90  >90 mL/min   GFR calc Af Amer >90  >90 mL/min   Comment: (NOTE)     The eGFR has been calculated using the CKD EPI equation.     This calculation has not been validated in all clinical situations.     eGFR's persistently <90 mL/min signify possible Chronic Kidney     Disease.  CBC     Status: None   Collection Time    05/28/13  4:34 PM      Result Value Range   WBC 9.5  4.0 - 10.5 K/uL   RBC 4.79  3.87 - 5.11 MIL/uL   Hemoglobin 14.0  12.0 - 15.0 g/dL   HCT 41.5  36.0 - 46.0 %   MCV 86.6  78.0 - 100.0 fL   MCH 29.2  26.0 - 34.0 pg   MCHC 33.7  30.0 - 36.0 g/dL   RDW 14.0  11.5 - 15.5 %   Platelets 313  150 - 400 K/uL   No results found.  Review of Systems  Constitutional: Negative.   HENT: Negative.   Eyes: Negative.   Respiratory: Negative.   Cardiovascular: Negative.   Gastrointestinal: Negative.   Genitourinary: Negative.   Musculoskeletal: Negative.   Skin: Negative.   Neurological: Negative.   Psychiatric/Behavioral: Negative.     There were no vitals taken for this visit. Physical Exam  Constitutional: She is oriented to person, place, and time. She appears well-developed.  HENT:  Head: Normocephalic.  Neck: Normal range of motion.  Respiratory: Effort normal.  GI: Soft. Bowel sounds are normal.  Musculoskeletal: Normal range of motion.  Neurological: She is alert and oriented to person, place, and time. She has normal strength. GCS eye subscore is 4. GCS verbal subscore is 5. GCS motor subscore is 6.  Reflex Scores:      Tricep reflexes are 2+ on  the right side and 2+ on the left side.      Bicep reflexes are 2+ on the right side and 2+ on the left side.      Brachioradialis reflexes are 2+ on the right side and 2+ on the left side.      Patellar reflexes are 2+ on the right side and 2+ on the left side.      Achilles reflexes are 2+ on the right side and 2+ on the left side. Strength is 5 out of 5 in her iliopsoas quads and she's gastrocs EHL the left looks to be the right lower extremity has some trace weakness in plantarflexion on toe walk  Skin: Skin is warm and dry.     Assessment/Plan 48 year old woman presents for right-sided L5-S1 laminectomy microdiscectomy  Andrian Urbach P 05/29/2013, 8:39 AM

## 2013-05-29 NOTE — Progress Notes (Signed)
Pt. Alert and oriented,follows simple instructions, denies pain. Incision area without swelling, redness or S/S of infection. Voiding adequate clear yellow urine. Moving all extremities well and vitals stable and documented. Patient discharged home with family.  Lumbar surgery notes instructions given to patient and family member for home safety and precautions. Pt. and family stated understanding of instructions given 

## 2013-05-29 NOTE — Discharge Summary (Signed)
  Physician Discharge Summary  Patient ID: Dana EdisMichelle C Bond MRN: [id number] DOB/AGE: 49/01/1966 49 y.o.  Admit date: 05/29/2013 Discharge date: 05/29/2013  Admission Diagnoses: Right S1 radiculopathy from recurrent ruptured disc L5-S1  Discharge Diagnoses: Same Active Problems:   HNP (herniated nucleus pulposus), lumbar   Discharged Condition: good  Hospital Course: Patient admitted hospital underwent a redo laminectomy microdiscectomy L5-S1 on the right postop patient did very well is ambulating and voiding on the floor tolerating regular diet stable for discharge.  Consults: Significant Diagnostic Studies: Treatments: Redo L5-S1 microdiscectomy on the right Discharge Exam: Blood pressure 108/50, pulse 113, temperature 97.9 F (36.6 C), resp. rate 20, SpO2 95.00%. Thank out of 5 wound clean and dry  Disposition: Home     Medication List         ALPRAZolam 0.5 MG tablet  Commonly known as:  XANAX  Take 0.5 mg by mouth 2 (two) times daily as needed for anxiety.     atorvastatin 10 MG tablet  Commonly known as:  LIPITOR  Take 10 mg by mouth daily.     carisoprodol 350 MG tablet  Commonly known as:  SOMA  Take 350 mg by mouth 3 (three) times daily as needed for muscle spasms.     colchicine 0.6 MG tablet  Take 0.6 mg by mouth daily.     DULoxetine 60 MG capsule  Commonly known as:  CYMBALTA  Take 60 mg by mouth 2 (two) times daily.     esomeprazole 40 MG capsule  Commonly known as:  NEXIUM  Take 40 mg by mouth 2 (two) times daily before a meal.     HYDROmorphone 4 MG tablet  Commonly known as:  DILAUDID  Take 4 mg by mouth every 4 (four) hours as needed for severe pain.     hydroxychloroquine 200 MG tablet  Commonly known as:  PLAQUENIL  Take 200 mg by mouth 2 (two) times daily.     methotrexate 1 G injection  Commonly known as:  50 mg/ml  Inject into the vein once.     methylPREDNISolone 4 MG tablet  Commonly known as:  MEDROL DOSEPAK  Take 4 mg by mouth.  follow package directions     ondansetron 8 MG tablet  Commonly known as:  ZOFRAN  Take 8 mg by mouth every 8 (eight) hours as needed for nausea or vomiting.     Oxycodone HCl 10 MG Tabs  Commonly known as:  ROXICODONE  Take 1 tablet (10 mg total) by mouth every 4 (four) hours as needed for severe pain.     pentoxifylline 400 MG CR tablet  Commonly known as:  TRENTAL  Take 400 mg by mouth 2 (two) times daily.     predniSONE 5 MG tablet  Commonly known as:  DELTASONE  Take 5 mg by mouth daily with breakfast.           Follow-up Information   [facility]Laquitta Dominski P, MD.   Specialty:  Neurosurgery   Contact information:   [address] [place] [address] [phone]       Signed: Pollyanna Levay P 05/29/2013, 5:56 PM

## 2013-05-29 NOTE — Anesthesia Preprocedure Evaluation (Signed)
Anesthesia Evaluation  Patient identified by MRN, date of birth, ID band Patient awake    Reviewed: Allergy & Precautions, H&P , NPO status , Patient's Chart, lab work & pertinent test results  Airway       Dental   Pulmonary asthma , former smoker,          Cardiovascular + Peripheral Vascular Disease     Neuro/Psych Anxiety  Neuromuscular disease    GI/Hepatic hiatal hernia, GERD-  ,  Endo/Other    Renal/GU      Musculoskeletal  (+) Fibromyalgia -  Abdominal   Peds  Hematology   Anesthesia Other Findings   Reproductive/Obstetrics                           Anesthesia Physical Anesthesia Plan  ASA: II  Anesthesia Plan: General   Post-op Pain Management:    Induction: Intravenous  Airway Management Planned: Oral ETT  Additional Equipment:   Intra-op Plan:   Post-operative Plan: Extubation in OR  Informed Consent: I have reviewed the patients History and Physical, chart, labs and discussed the procedure including the risks, benefits and alternatives for the proposed anesthesia with the patient or authorized representative who has indicated his/her understanding and acceptance.     Plan Discussed with:   Anesthesia Plan Comments:         Anesthesia Quick Evaluation

## 2013-05-29 NOTE — Progress Notes (Signed)
ANTIBIOTIC CONSULT NOTE - INITIAL  Pharmacy Consult for vancomycin Indication: surgical prophylaxis s/p neurosurgery  Allergies  Allergen Reactions  . Augmentin [Amoxicillin-Pot Clavulanate] Anaphylaxis  . Ciprofloxacin Anaphylaxis  . Lyrica [Pregabalin] Anaphylaxis  . Gabapentin Swelling  . Imuran [Azathioprine] Rash    Patient Measurements:   Adjusted Body Weight:   Vital Signs: Temp: 98.1 F (36.7 C) (01/07 1210) BP: 129/63 mmHg (01/07 1210) Pulse Rate: 101 (01/07 1210) Intake/Output from previous day:   Intake/Output from this shift: Total I/O In: 1440 [P.O.:240; I.V.:1200] Out: 50 [Blood:50]  Labs:  Recent Labs  05/28/13 1634  WBC 9.5  HGB 14.0  PLT 313  CREATININE 0.61   CrCl is unknown because there is no height on file for the current visit. No results found for this basename: VANCOTROUGH, Leodis Binet, VANCORANDOM, GENTTROUGH, GENTPEAK, GENTRANDOM, TOBRATROUGH, TOBRAPEAK, TOBRARND, AMIKACINPEAK, AMIKACINTROU, AMIKACIN,  in the last 72 hours   Microbiology: Recent Results (from the past 720 hour(s))  SURGICAL PCR SCREEN     Status: Abnormal   Collection Time    05/28/13  4:32 PM      Result Value Range Status   MRSA, PCR NEGATIVE  NEGATIVE Final   Staphylococcus aureus POSITIVE (*) NEGATIVE Final   Comment:            The Xpert SA Assay (FDA     approved for NASAL specimens     in patients over 41 years of age),     is one component of     a comprehensive surveillance     program.  Test performance has     been validated by [employer] for patients greater     than or equal to 21 year old.     It is not intended     to diagnose infection nor to     guide or monitor treatment.    Medical History: Past Medical History  Diagnosis Date  . Lupus   . Hyperlipemia   . Family history of anesthesia complication     Mother N/V  . Peripheral vascular disease     on Plental  . Anxiety   . Mental disorder   . Asthma     been a while  . GERD  (gastroesophageal reflux disease)   . Hemorrhoid   . H/O hiatal hernia   . Fibromyalgia   . Arthritis   . DVT (deep venous thrombosis) 2012    post op  . Pleurisy   . Costochondritis     Medications:  Scheduled:  . atorvastatin  10 mg Oral Daily  . carisoprodol  350 mg Oral QID  . colchicine  0.6 mg Oral Daily  . cyclobenzaprine      . docusate sodium  100 mg Oral BID  . DULoxetine  60 mg Oral BID  . HYDROmorphone      . HYDROmorphone      . HYDROmorphone      . hydroxychloroquine  200 mg Oral BID  . midazolam      . mupirocin ointment   Nasal BID  . [START ON 05/30/2013] pantoprazole  40 mg Oral Daily  . pentoxifylline  400 mg Oral BID  . [START ON 05/30/2013] predniSONE  5 mg Oral Q breakfast  . sodium chloride  3 mL Intravenous Q12H   Infusions:  . sodium chloride     Assessment: 48 yo female s/p neurosurgery will be put on vancomycin for surgical prophylaxis.  SCr 0.61. Wt 102.1  kg.  Patient received one dose of vancomycin 1g iv at 0854 today.  Per RN, patient doesn't have a drain.  Goal of Therapy:  Vancomycin trough level 15-20 mcg/ml  Plan:  1) Vancomycin 1g iv x1 at 2100 tonight.  Pharmacy will sign off.   Mayerly Kaman, Tsz-Yin 05/29/2013,12:29 PM

## 2013-05-29 NOTE — Progress Notes (Signed)
Subjective: Patient reports Patient continues to make progress Prempro been pain although still significantly debilitated still has some anxiety with regard to being discharged does not feel that she her husband to take care of home at this point.  Objective: Vital signs in last 24 hours: Temp:  [97.7 F (36.5 C)] 97.7 F (36.5 C) (01/06 1556) Pulse Rate:  [128-145] 128 (01/06 1635) Resp:  [20] 20 (01/06 1556) BP: (130)/(90) 130/90 mmHg (01/06 1556) SpO2:  [93 %] 93 % (01/06 1556) Weight:  [102.059 kg (225 lb)] 102.059 kg (225 lb) (01/06 1556)  Intake/Output from previous day:   Intake/Output this shift:    Awake alert oriented strength out of 5 wound clean and dry  Lab Results:  Recent Labs  05/28/13 1634  WBC 9.5  HGB 14.0  HCT 41.5  PLT 313   BMET  Recent Labs  05/28/13 1634  NA 143  K 3.6*  CL 101  CO2 26  GLUCOSE 153*  BUN 7  CREATININE 0.61  CALCIUM 9.4    Studies/Results: No results found.  Assessment/Plan:  continue to mobilize physical therapy still researching skilled nursing for discharge patient is ready when that becomes available as an acceptable institution.  LOS: 0 days     Lynsee Wands P 05/29/2013, 8:47 AM

## 2013-05-29 NOTE — Anesthesia Postprocedure Evaluation (Signed)
  Anesthesia Post-op Note  Patient: Dana Bond  Procedure(s) Performed: Procedure(s) with comments: LUMBAR LAMINECTOMY/DECOMPRESSION MICRODISCECTOMY 1 LEVEL (Right) - LUMBAR LAMINECTOMY/DECOMPRESSION MICRODISCECTOMY 1 LEVEL  Patient Location: PACU  Anesthesia Type:General  Level of Consciousness: awake, alert , oriented and patient cooperative  Airway and Oxygen Therapy: Patient Spontanous Breathing  Post-op Pain: moderate  Post-op Assessment: Post-op Vital signs reviewed, Patient's Cardiovascular Status Stable, Respiratory Function Stable, Patent Airway, No signs of Nausea or vomiting and Pain level controlled  Post-op Vital Signs: stable  Complications: No apparent anesthesia complications

## 2013-06-04 ENCOUNTER — Encounter (HOSPITAL_COMMUNITY): Payer: Self-pay | Admitting: Neurosurgery

## 2014-01-28 ENCOUNTER — Other Ambulatory Visit: Payer: Self-pay | Admitting: Neurosurgery

## 2014-01-31 ENCOUNTER — Encounter (HOSPITAL_COMMUNITY): Payer: Self-pay | Admitting: Pharmacy Technician

## 2014-01-31 NOTE — Pre-Procedure Instructions (Signed)
LEQUITA MEADOWCROFT  01/31/2014   Your procedure is scheduled on:  Monday, September 21st  Report to [facility] Admitting at 945 AM.  Call this number if you have problems the morning of surgery: [phone]   Remember:   Do not eat food or drink liquids after midnight.   Take these medicines the morning of surgery with A SIP OF WATER: nexium, prednisone, cymbalta, xanax if needed, zofran if needed, oxycodone if needed   Do not wear jewelry, make-up or nail polish.  Do not wear lotions, powders, or perfumes. You may wear deodorant.  Do not shave 48 hours prior to surgery. Men may shave face and neck.  Do not bring valuables to the hospital.  [facility] is not responsible  for any belongings or valuables.               Contacts, dentures or bridgework may not be worn into surgery.  Leave suitcase in the car. After surgery it may be brought to your room.  For patients admitted to the hospital, discharge time is determined by your treatment team.               Patients discharged the day of surgery will not be allowed to drive home.  Please read over the following fact sheets that you were given: Pain Booklet, Coughing and Deep Breathing, MRSA Information and Surgical Site Infection Prevention [facility] - Preparing for Surgery  Before surgery, you can play an important role.  Because skin is not sterile, your skin needs to be as free of germs as possible.  You can reduce the number of germs on you skin by washing with CHG (chlorahexidine gluconate) soap before surgery.  CHG is an antiseptic cleaner which kills germs and bonds with the skin to continue killing germs even after washing.  Please DO NOT use if you have an allergy to CHG or antibacterial soaps.  If your skin becomes reddened/irritated stop using the CHG and inform your nurse when you arrive at Short Stay.  Do not shave (including legs and underarms) for at least 48 hours prior to the first CHG shower.  You may shave  your face.  Please follow these instructions carefully:   1.  Shower with CHG Soap the night before surgery and the morning of Surgery.  2.  If you choose to wash your hair, wash your hair first as usual with your normal shampoo.  3.  After you shampoo, rinse your hair and body thoroughly to remove the shampoo.  4.  Use CHG as you would any other liquid soap.  You can apply CHG directly to the skin and wash gently with scrungie or a clean washcloth.  5.  Apply the CHG Soap to your body ONLY FROM THE NECK DOWN.  Do not use on open wounds or open sores.  Avoid contact with your eyes, ears, mouth and genitals (private parts).  Wash genitals (private parts) with your normal soap.  6.  Wash thoroughly, paying special attention to the area where your surgery will be performed.  7.  Thoroughly rinse your body with warm water from the neck down.  8.  DO NOT shower/wash with your normal soap after using and rinsing off the CHG Soap.  9.  Pat yourself dry with a clean towel.            10.  Wear clean pajamas.            11.  Place clean sheets on your bed the night of your first shower and do not sleep with pets.  Day of Surgery  Do not apply any lotions/deoderants the morning of surgery.  Please wear clean clothes to the hospital/surgery center.

## 2014-02-03 ENCOUNTER — Encounter (HOSPITAL_COMMUNITY): Payer: Self-pay

## 2014-02-03 ENCOUNTER — Encounter (HOSPITAL_COMMUNITY)
Admission: RE | Admit: 2014-02-03 | Discharge: 2014-02-03 | Disposition: A | Discharge status: Home / Self Care | Payer: BC Managed Care – PPO | Source: Ambulatory Visit | Attending: Anesthesiology | Admitting: Anesthesiology

## 2014-02-03 ENCOUNTER — Encounter (HOSPITAL_COMMUNITY)
Admission: RE | Admit: 2014-02-03 | Discharge: 2014-02-03 | Disposition: A | Discharge status: Home / Self Care | Payer: BC Managed Care – PPO | Source: Ambulatory Visit | Attending: Neurosurgery | Admitting: Neurosurgery

## 2014-02-03 DIAGNOSIS — Z981 Arthrodesis status: Secondary | ICD-10-CM | POA: Insufficient documentation

## 2014-02-03 DIAGNOSIS — Z01810 Encounter for preprocedural cardiovascular examination: Secondary | ICD-10-CM | POA: Insufficient documentation

## 2014-02-03 DIAGNOSIS — Z01812 Encounter for preprocedural laboratory examination: Secondary | ICD-10-CM | POA: Insufficient documentation

## 2014-02-03 DIAGNOSIS — IMO0002 Reserved for concepts with insufficient information to code with codable children: Secondary | ICD-10-CM | POA: Insufficient documentation

## 2014-02-03 HISTORY — DX: Major depressive disorder, single episode, unspecified: F32.9

## 2014-02-03 HISTORY — DX: Depression, unspecified: F32.A

## 2014-02-03 HISTORY — DX: Migraine, unspecified, not intractable, without status migrainosus: G43.909

## 2014-02-03 HISTORY — DX: Anemia, unspecified: D64.9

## 2014-02-03 HISTORY — DX: Headache: R51

## 2014-02-03 LAB — BASIC METABOLIC PANEL
Anion gap: 11 (ref 5–15)
BUN: 10 mg/dL (ref 6–23)
CALCIUM: 9.5 mg/dL (ref 8.4–10.5)
CO2: 27 meq/L (ref 19–32)
CREATININE: 0.67 mg/dL (ref 0.50–1.10)
Chloride: 100 mEq/L (ref 96–112)
GFR calc Af Amer: >90 mL/min (ref >90)
GLUCOSE: 107 mg/dL — AB (ref 70–99)
Potassium: 4.8 mEq/L (ref 3.7–5.3)
Sodium: 138 mEq/L (ref 137–147)

## 2014-02-03 LAB — CBC
HEMATOCRIT: 39.1 % (ref 36.0–46.0)
Hemoglobin: 12.7 g/dL (ref 12.0–15.0)
MCH: 27.9 pg (ref 26.0–34.0)
MCHC: 32.5 g/dL (ref 30.0–36.0)
MCV: 85.9 fL (ref 78.0–100.0)
PLATELETS: 337 10*3/uL (ref 150–400)
RBC: 4.55 MIL/uL (ref 3.87–5.11)
RDW: 13.9 % (ref 11.5–15.5)
WBC: 8.9 10*3/uL (ref 4.0–10.5)

## 2014-02-03 LAB — SURGICAL PCR SCREEN
MRSA, PCR: NEGATIVE
Staphylococcus aureus: POSITIVE — AB

## 2014-02-03 NOTE — Progress Notes (Signed)
Primary - [facility] - cheri booth PA No cardiologist ekg in epic from jan 2015 no other testing

## 2014-02-03 NOTE — Progress Notes (Signed)
Called patient and left message for positive nasal swab. Patient stated she had mupirocin from previous positive swab earlier this year and did not need new prescription called in.

## 2014-02-09 MED ORDER — SODIUM CHLORIDE 0.9 % IV SOLN
1500.0000 mg | INTRAVENOUS | Status: AC
  Administered 2014-02-10: 1500 mg via INTRAVENOUS
  Filled 2014-02-09: qty 1500

## 2014-02-09 MED ORDER — DEXAMETHASONE SODIUM PHOSPHATE 10 MG/ML IJ SOLN
10.0000 mg | INTRAMUSCULAR | Status: AC
  Administered 2014-02-10: 8 mg via INTRAVENOUS

## 2014-02-10 ENCOUNTER — Encounter (HOSPITAL_COMMUNITY): Payer: Self-pay | Admitting: Certified Registered Nurse Anesthetist

## 2014-02-10 ENCOUNTER — Inpatient Hospital Stay (HOSPITAL_COMMUNITY)
Admission: RE | Admit: 2014-02-10 | Discharge: 2014-02-12 | DRG: 473 | Disposition: A | Discharge status: Home / Self Care | Payer: BC Managed Care – PPO | Source: Ambulatory Visit | Attending: Neurosurgery | Admitting: Neurosurgery

## 2014-02-10 ENCOUNTER — Inpatient Hospital Stay (HOSPITAL_COMMUNITY): Payer: BC Managed Care – PPO

## 2014-02-10 ENCOUNTER — Encounter (HOSPITAL_COMMUNITY)
Admission: RE | Disposition: A | Discharge status: Home / Self Care | Payer: Self-pay | Source: Ambulatory Visit | Attending: Neurosurgery

## 2014-02-10 ENCOUNTER — Inpatient Hospital Stay (HOSPITAL_COMMUNITY): Payer: BC Managed Care – PPO | Admitting: Certified Registered Nurse Anesthetist

## 2014-02-10 ENCOUNTER — Encounter (HOSPITAL_COMMUNITY): Payer: BC Managed Care – PPO | Admitting: Certified Registered Nurse Anesthetist

## 2014-02-10 DIAGNOSIS — D649 Anemia, unspecified: Secondary | ICD-10-CM | POA: Diagnosis present

## 2014-02-10 DIAGNOSIS — M549 Dorsalgia, unspecified: Secondary | ICD-10-CM | POA: Diagnosis present

## 2014-02-10 DIAGNOSIS — M329 Systemic lupus erythematosus, unspecified: Secondary | ICD-10-CM | POA: Diagnosis present

## 2014-02-10 DIAGNOSIS — M79609 Pain in unspecified limb: Secondary | ICD-10-CM | POA: Diagnosis present

## 2014-02-10 DIAGNOSIS — IMO0001 Reserved for inherently not codable concepts without codable children: Secondary | ICD-10-CM | POA: Diagnosis present

## 2014-02-10 DIAGNOSIS — R51 Headache: Secondary | ICD-10-CM | POA: Diagnosis not present

## 2014-02-10 DIAGNOSIS — E785 Hyperlipidemia, unspecified: Secondary | ICD-10-CM | POA: Diagnosis present

## 2014-02-10 DIAGNOSIS — F411 Generalized anxiety disorder: Secondary | ICD-10-CM | POA: Diagnosis present

## 2014-02-10 DIAGNOSIS — I739 Peripheral vascular disease, unspecified: Secondary | ICD-10-CM | POA: Diagnosis present

## 2014-02-10 DIAGNOSIS — K219 Gastro-esophageal reflux disease without esophagitis: Secondary | ICD-10-CM | POA: Diagnosis present

## 2014-02-10 DIAGNOSIS — F3289 Other specified depressive episodes: Secondary | ICD-10-CM | POA: Diagnosis present

## 2014-02-10 DIAGNOSIS — Z87891 Personal history of nicotine dependence: Secondary | ICD-10-CM

## 2014-02-10 DIAGNOSIS — Z86718 Personal history of other venous thrombosis and embolism: Secondary | ICD-10-CM

## 2014-02-10 DIAGNOSIS — M5440 Lumbago with sciatica, unspecified side: Secondary | ICD-10-CM

## 2014-02-10 DIAGNOSIS — IMO0002 Reserved for concepts with insufficient information to code with codable children: Principal | ICD-10-CM | POA: Diagnosis present

## 2014-02-10 DIAGNOSIS — J45909 Unspecified asthma, uncomplicated: Secondary | ICD-10-CM | POA: Diagnosis present

## 2014-02-10 DIAGNOSIS — F329 Major depressive disorder, single episode, unspecified: Secondary | ICD-10-CM | POA: Diagnosis present

## 2014-02-10 HISTORY — PX: POSTERIOR CERVICAL FUSION/FORAMINOTOMY: SHX5038

## 2014-02-10 SURGERY — POSTERIOR CERVICAL FUSION/FORAMINOTOMY LEVEL 2
Anesthesia: General | Site: Spine Cervical

## 2014-02-10 MED ORDER — DIAZEPAM 5 MG/ML IJ SOLN
INTRAMUSCULAR | Status: AC
  Filled 2014-02-10: qty 2

## 2014-02-10 MED ORDER — COLCHICINE 0.6 MG PO TABS
0.6000 mg | ORAL_TABLET | Freq: Every day | ORAL | Status: DC
  Administered 2014-02-11 – 2014-02-12 (×2): 0.6 mg via ORAL
  Filled 2014-02-10 (×2): qty 1

## 2014-02-10 MED ORDER — OXYCODONE HCL 5 MG/5ML PO SOLN: 5.0000 mg | Freq: Once | ORAL | Status: DC | PRN

## 2014-02-10 MED ORDER — DIAZEPAM 5 MG PO TABS
10.0000 mg | ORAL_TABLET | Freq: Four times a day (QID) | ORAL | Status: DC | PRN
  Administered 2014-02-10 – 2014-02-12 (×7): 10 mg via ORAL
  Filled 2014-02-10 (×7): qty 2

## 2014-02-10 MED ORDER — FENTANYL CITRATE 0.05 MG/ML IJ SOLN
INTRAMUSCULAR | Status: AC
  Filled 2014-02-10: qty 5

## 2014-02-10 MED ORDER — MORPHINE SULFATE 2 MG/ML IJ SOLN
1.0000 mg | INTRAMUSCULAR | Status: DC | PRN
  Administered 2014-02-10 – 2014-02-12 (×7): 1 mg via INTRAVENOUS
  Filled 2014-02-10 (×7): qty 1

## 2014-02-10 MED ORDER — GLYCOPYRROLATE 0.2 MG/ML IJ SOLN
INTRAMUSCULAR | Status: AC
  Filled 2014-02-10: qty 4

## 2014-02-10 MED ORDER — OXYCODONE HCL 5 MG PO TABS: 5.0000 mg | ORAL_TABLET | Freq: Once | ORAL | Status: DC | PRN

## 2014-02-10 MED ORDER — VANCOMYCIN HCL 10 G IV SOLR
1250.0000 mg | Freq: Two times a day (BID) | INTRAVENOUS | Status: DC
  Administered 2014-02-10 – 2014-02-12 (×4): 1250 mg via INTRAVENOUS
  Filled 2014-02-10 (×5): qty 1250

## 2014-02-10 MED ORDER — PROMETHAZINE HCL 25 MG/ML IJ SOLN: 6.2500 mg | INTRAMUSCULAR | Status: DC | PRN

## 2014-02-10 MED ORDER — ONDANSETRON HCL 4 MG/2ML IJ SOLN
INTRAMUSCULAR | Status: AC
  Filled 2014-02-10: qty 2

## 2014-02-10 MED ORDER — MIDAZOLAM HCL 2 MG/2ML IJ SOLN
INTRAMUSCULAR | Status: AC
  Filled 2014-02-10: qty 2

## 2014-02-10 MED ORDER — SODIUM CHLORIDE 0.9 % IR SOLN
Status: DC | PRN
  Administered 2014-02-10: 13:00:00

## 2014-02-10 MED ORDER — LACTATED RINGERS IV SOLN
INTRAVENOUS | Status: DC
Start: 2014-02-10 — End: 2014-02-12
  Administered 2014-02-10: 10:00:00 via INTRAVENOUS

## 2014-02-10 MED ORDER — PANTOPRAZOLE SODIUM 40 MG PO TBEC
40.0000 mg | DELAYED_RELEASE_TABLET | Freq: Every day | ORAL | Status: DC
  Administered 2014-02-11 – 2014-02-12 (×2): 40 mg via ORAL
  Filled 2014-02-10 (×2): qty 1

## 2014-02-10 MED ORDER — VECURONIUM BROMIDE 10 MG IV SOLR
INTRAVENOUS | Status: DC | PRN
  Administered 2014-02-10: 2 mg via INTRAVENOUS
  Administered 2014-02-10: 1 mg via INTRAVENOUS

## 2014-02-10 MED ORDER — ONDANSETRON HCL 4 MG PO TABS: 8.0000 mg | ORAL_TABLET | Freq: Three times a day (TID) | ORAL | Status: DC | PRN

## 2014-02-10 MED ORDER — PROPOFOL 10 MG/ML IV BOLUS
INTRAVENOUS | Status: AC
  Filled 2014-02-10: qty 20

## 2014-02-10 MED ORDER — FENTANYL CITRATE 0.05 MG/ML IJ SOLN
INTRAMUSCULAR | Status: DC | PRN
  Administered 2014-02-10: 50 ug via INTRAVENOUS
  Administered 2014-02-10: 25 ug via INTRAVENOUS
  Administered 2014-02-10: 50 ug via INTRAVENOUS
  Administered 2014-02-10: 25 ug via INTRAVENOUS
  Administered 2014-02-10: 100 ug via INTRAVENOUS
  Administered 2014-02-10: 25 ug via INTRAVENOUS
  Administered 2014-02-10 (×3): 50 ug via INTRAVENOUS
  Administered 2014-02-10: 25 ug via INTRAVENOUS
  Administered 2014-02-10: 50 ug via INTRAVENOUS

## 2014-02-10 MED ORDER — LIDOCAINE HCL (CARDIAC) 20 MG/ML IV SOLN
INTRAVENOUS | Status: AC
  Filled 2014-02-10: qty 10

## 2014-02-10 MED ORDER — LACTATED RINGERS IV SOLN
INTRAVENOUS | Status: DC | PRN
  Administered 2014-02-10 (×2): via INTRAVENOUS

## 2014-02-10 MED ORDER — TRAZODONE HCL 100 MG PO TABS
100.0000 mg | ORAL_TABLET | Freq: Every day | ORAL | Status: DC
  Administered 2014-02-10 – 2014-02-11 (×2): 100 mg via ORAL
  Filled 2014-02-10 (×2): qty 1

## 2014-02-10 MED ORDER — VECURONIUM BROMIDE 10 MG IV SOLR
INTRAVENOUS | Status: AC
  Filled 2014-02-10: qty 10

## 2014-02-10 MED ORDER — PROPOFOL 10 MG/ML IV BOLUS
INTRAVENOUS | Status: DC | PRN
  Administered 2014-02-10 (×2): 40 mg via INTRAVENOUS
  Administered 2014-02-10: 120 mg via INTRAVENOUS

## 2014-02-10 MED ORDER — HYDROMORPHONE HCL 2 MG PO TABS
4.0000 mg | ORAL_TABLET | ORAL | Status: DC | PRN
  Administered 2014-02-10 – 2014-02-12 (×9): 4 mg via ORAL
  Filled 2014-02-10 (×9): qty 2

## 2014-02-10 MED ORDER — ONDANSETRON HCL 4 MG/2ML IJ SOLN
INTRAMUSCULAR | Status: DC | PRN
  Administered 2014-02-10: 4 mg via INTRAVENOUS

## 2014-02-10 MED ORDER — NEOSTIGMINE METHYLSULFATE 10 MG/10ML IV SOLN
INTRAVENOUS | Status: DC | PRN
  Administered 2014-02-10: 5 mg via INTRAVENOUS

## 2014-02-10 MED ORDER — LIDOCAINE HCL 4 % MT SOLN
OROMUCOSAL | Status: DC | PRN
  Administered 2014-02-10: 4 mL via TOPICAL

## 2014-02-10 MED ORDER — EPHEDRINE SULFATE 50 MG/ML IJ SOLN
INTRAMUSCULAR | Status: DC | PRN
  Administered 2014-02-10: 10 mg via INTRAVENOUS

## 2014-02-10 MED ORDER — GLYCOPYRROLATE 0.2 MG/ML IJ SOLN
INTRAMUSCULAR | Status: DC | PRN
  Administered 2014-02-10: .8 mg via INTRAVENOUS

## 2014-02-10 MED ORDER — ALPRAZOLAM 0.5 MG PO TABS
1.0000 mg | ORAL_TABLET | Freq: Two times a day (BID) | ORAL | Status: DC | PRN
  Administered 2014-02-11 – 2014-02-12 (×2): 1 mg via ORAL
  Filled 2014-02-10 (×2): qty 2

## 2014-02-10 MED ORDER — BACITRACIN ZINC 500 UNIT/GM EX OINT
TOPICAL_OINTMENT | CUTANEOUS | Status: DC | PRN
  Administered 2014-02-10: 1 via TOPICAL

## 2014-02-10 MED ORDER — LIDOCAINE HCL (CARDIAC) 20 MG/ML IV SOLN
INTRAVENOUS | Status: DC | PRN
  Administered 2014-02-10: 40 mg via INTRAVENOUS
  Administered 2014-02-10: 10 mg via INTRAVENOUS

## 2014-02-10 MED ORDER — PHENYLEPHRINE HCL 10 MG/ML IJ SOLN
10.0000 mg | INTRAMUSCULAR | Status: DC | PRN
  Administered 2014-02-10: 30 ug/min via INTRAVENOUS

## 2014-02-10 MED ORDER — ALBUTEROL SULFATE (2.5 MG/3ML) 0.083% IN NEBU
3.0000 mL | INHALATION_SOLUTION | Freq: Four times a day (QID) | RESPIRATORY_TRACT | Status: DC | PRN
  Filled 2014-02-10: qty 3

## 2014-02-10 MED ORDER — ROCURONIUM BROMIDE 50 MG/5ML IV SOLN
INTRAVENOUS | Status: AC
  Filled 2014-02-10: qty 1

## 2014-02-10 MED ORDER — HYDROMORPHONE HCL 1 MG/ML IJ SOLN
INTRAMUSCULAR | Status: AC
  Filled 2014-02-10: qty 1

## 2014-02-10 MED ORDER — DULOXETINE HCL 60 MG PO CPEP
60.0000 mg | ORAL_CAPSULE | Freq: Two times a day (BID) | ORAL | Status: DC
  Administered 2014-02-10 – 2014-02-12 (×4): 60 mg via ORAL
  Filled 2014-02-10 (×3): qty 1
  Filled 2014-02-10: qty 2

## 2014-02-10 MED ORDER — VITAMIN D 1000 UNITS PO TABS
1000.0000 [IU] | ORAL_TABLET | Freq: Every day | ORAL | Status: DC
  Administered 2014-02-11 – 2014-02-12 (×2): 1000 [IU] via ORAL
  Filled 2014-02-10 (×2): qty 1

## 2014-02-10 MED ORDER — HYDROCORTISONE 2.5 % RE CREA
TOPICAL_CREAM | Freq: Two times a day (BID) | RECTAL | Status: DC
  Administered 2014-02-10 – 2014-02-11 (×2): via RECTAL
  Filled 2014-02-10: qty 28.35

## 2014-02-10 MED ORDER — DIAZEPAM 5 MG/ML IJ SOLN
2.5000 mg | Freq: Once | INTRAMUSCULAR | Status: AC
  Administered 2014-02-10: 2.5 mg via INTRAVENOUS

## 2014-02-10 MED ORDER — LIDOCAINE-EPINEPHRINE 1 %-1:100000 IJ SOLN
INTRAMUSCULAR | Status: DC | PRN
  Administered 2014-02-10: 7.5 mL via INTRADERMAL

## 2014-02-10 MED ORDER — OXYCODONE HCL 5 MG PO TABS: 10.0000 mg | ORAL_TABLET | ORAL | Status: DC | PRN

## 2014-02-10 MED ORDER — MIDAZOLAM HCL 5 MG/5ML IJ SOLN
INTRAMUSCULAR | Status: DC | PRN
  Administered 2014-02-10: 2 mg via INTRAVENOUS

## 2014-02-10 MED ORDER — FERROUS SULFATE 325 (65 FE) MG PO TABS
325.0000 mg | ORAL_TABLET | Freq: Every day | ORAL | Status: DC
  Administered 2014-02-11 – 2014-02-12 (×2): 325 mg via ORAL
  Filled 2014-02-10 (×2): qty 1

## 2014-02-10 MED ORDER — PREDNISONE 5 MG PO TABS
5.0000 mg | ORAL_TABLET | Freq: Every day | ORAL | Status: DC
  Administered 2014-02-11 – 2014-02-12 (×2): 5 mg via ORAL
  Filled 2014-02-10 (×2): qty 1

## 2014-02-10 MED ORDER — CHLORHEXIDINE GLUCONATE 0.12 % MT SOLN
15.0000 mL | Freq: Two times a day (BID) | OROMUCOSAL | Status: DC
  Administered 2014-02-10 – 2014-02-12 (×3): 15 mL via OROMUCOSAL
  Filled 2014-02-10 (×3): qty 15

## 2014-02-10 MED ORDER — CARISOPRODOL 350 MG PO TABS
350.0000 mg | ORAL_TABLET | Freq: Three times a day (TID) | ORAL | Status: DC | PRN
  Administered 2014-02-11 – 2014-02-12 (×3): 350 mg via ORAL
  Filled 2014-02-10 (×3): qty 1

## 2014-02-10 MED ORDER — PENICILLIN V POTASSIUM 500 MG PO TABS: 500.0000 mg | ORAL_TABLET | Freq: Four times a day (QID) | ORAL | Status: DC

## 2014-02-10 MED ORDER — FOLIC ACID 1 MG PO TABS
1.0000 mg | ORAL_TABLET | Freq: Every day | ORAL | Status: DC
  Administered 2014-02-11 – 2014-02-12 (×2): 1 mg via ORAL
  Filled 2014-02-10 (×2): qty 1

## 2014-02-10 MED ORDER — HYDROXYCHLOROQUINE SULFATE 200 MG PO TABS
400.0000 mg | ORAL_TABLET | Freq: Every day | ORAL | Status: DC
  Administered 2014-02-11 – 2014-02-12 (×2): 400 mg via ORAL
  Filled 2014-02-10 (×2): qty 2

## 2014-02-10 MED ORDER — HYDROMORPHONE HCL 1 MG/ML IJ SOLN
INTRAMUSCULAR | Status: AC
  Administered 2014-02-10: 0.5 mg via INTRAVENOUS
  Filled 2014-02-10: qty 1

## 2014-02-10 MED ORDER — HYDROMORPHONE HCL 1 MG/ML IJ SOLN
0.2500 mg | INTRAMUSCULAR | Status: DC | PRN
  Administered 2014-02-10 (×4): 0.5 mg via INTRAVENOUS

## 2014-02-10 MED ORDER — ATORVASTATIN CALCIUM 10 MG PO TABS
20.0000 mg | ORAL_TABLET | Freq: Every day | ORAL | Status: DC
  Administered 2014-02-10 – 2014-02-11 (×2): 20 mg via ORAL
  Filled 2014-02-10 (×2): qty 2

## 2014-02-10 MED ORDER — ROCURONIUM BROMIDE 100 MG/10ML IV SOLN
INTRAVENOUS | Status: DC | PRN
  Administered 2014-02-10: 40 mg via INTRAVENOUS

## 2014-02-10 MED ORDER — THROMBIN 20000 UNITS EX SOLR
CUTANEOUS | Status: DC | PRN
  Administered 2014-02-10: 13:00:00 via TOPICAL

## 2014-02-10 SURGICAL SUPPLY — 71 items
ALLOSTEM STRIP 20MMX50MM (Tissue) ×2 IMPLANT
BAG DECANTER FOR FLEXI CONT (MISCELLANEOUS) ×2 IMPLANT
BENZOIN TINCTURE PRP APPL 2/3 (GAUZE/BANDAGES/DRESSINGS) ×2 IMPLANT
BLADE SURG 11 STRL SS (BLADE) ×2 IMPLANT
BLADE SURG ROTATE 9660 (MISCELLANEOUS) ×2 IMPLANT
BONE ALLOSTEM MORSELIZED 5CC (Bone Implant) ×2 IMPLANT
BRUSH SCRUB EZ PLAIN DRY (MISCELLANEOUS) ×2 IMPLANT
BUR MATCHSTICK NEURO 3.0 LAGG (BURR) ×2 IMPLANT
CANISTER SUCT 3000ML (MISCELLANEOUS) ×2 IMPLANT
CAP ELLIPSE LOCKING (Cap) ×12 IMPLANT
CONT SPEC 4OZ CLIKSEAL STRL BL (MISCELLANEOUS) ×2 IMPLANT
DECANTER SPIKE VIAL GLASS SM (MISCELLANEOUS) ×2 IMPLANT
DERMABOND ADHESIVE PROPEN (GAUZE/BANDAGES/DRESSINGS) ×1
DERMABOND ADVANCED (GAUZE/BANDAGES/DRESSINGS) ×1
DERMABOND ADVANCED .7 DNX12 (GAUZE/BANDAGES/DRESSINGS) ×1 IMPLANT
DERMABOND ADVANCED .7 DNX6 (GAUZE/BANDAGES/DRESSINGS) ×1 IMPLANT
DRAPE C-ARM 42X72 X-RAY (DRAPES) ×4 IMPLANT
DRAPE LAPAROTOMY 100X72 PEDS (DRAPES) ×2 IMPLANT
DRAPE MICROSCOPE LEICA (MISCELLANEOUS) IMPLANT
DRAPE POUCH INSTRU U-SHP 10X18 (DRAPES) ×2 IMPLANT
DRAPE SURG 17X23 STRL (DRAPES) ×4 IMPLANT
DRSG OPSITE 4X5.5 SM (GAUZE/BANDAGES/DRESSINGS) ×2 IMPLANT
DRSG OPSITE POSTOP 4X6 (GAUZE/BANDAGES/DRESSINGS) ×2 IMPLANT
DURAPREP 26ML APPLICATOR (WOUND CARE) IMPLANT
DURAPREP 6ML APPLICATOR 50/CS (WOUND CARE) ×2 IMPLANT
ELECT REM PT RETURN 9FT ADLT (ELECTROSURGICAL) ×2
ELECTRODE REM PT RTRN 9FT ADLT (ELECTROSURGICAL) ×1 IMPLANT
GAUZE SPONGE 4X4 12PLY STRL (GAUZE/BANDAGES/DRESSINGS) ×2 IMPLANT
GAUZE SPONGE 4X4 16PLY XRAY LF (GAUZE/BANDAGES/DRESSINGS) IMPLANT
GLOVE BIO SURGEON STRL SZ8 (GLOVE) ×4 IMPLANT
GLOVE BIO SURGEON STRL SZ8.5 (GLOVE) ×2 IMPLANT
GLOVE BIOGEL PI IND STRL 7.5 (GLOVE) ×1 IMPLANT
GLOVE BIOGEL PI INDICATOR 7.5 (GLOVE) ×1
GLOVE EXAM NITRILE LRG STRL (GLOVE) IMPLANT
GLOVE EXAM NITRILE MD LF STRL (GLOVE) ×2 IMPLANT
GLOVE EXAM NITRILE XL STR (GLOVE) IMPLANT
GLOVE EXAM NITRILE XS STR PU (GLOVE) IMPLANT
GLOVE INDICATOR 8.5 STRL (GLOVE) ×2 IMPLANT
GLOVE SS BIOGEL STRL SZ 8 (GLOVE) ×1 IMPLANT
GLOVE SUPERSENSE BIOGEL SZ 8 (GLOVE) ×1
GLOVE SURG SS PI 7.0 STRL IVOR (GLOVE) ×4 IMPLANT
GOWN STRL REUS W/ TWL LRG LVL3 (GOWN DISPOSABLE) ×1 IMPLANT
GOWN STRL REUS W/ TWL XL LVL3 (GOWN DISPOSABLE) ×2 IMPLANT
GOWN STRL REUS W/TWL 2XL LVL3 (GOWN DISPOSABLE) IMPLANT
GOWN STRL REUS W/TWL LRG LVL3 (GOWN DISPOSABLE) ×1
GOWN STRL REUS W/TWL XL LVL3 (GOWN DISPOSABLE) ×2
KIT BASIN OR (CUSTOM PROCEDURE TRAY) ×2 IMPLANT
KIT ROOM TURNOVER OR (KITS) ×2 IMPLANT
MARKER SKIN DUAL TIP RULER LAB (MISCELLANEOUS) ×2 IMPLANT
NEEDLE HYPO 25X1 1.5 SAFETY (NEEDLE) ×2 IMPLANT
NEEDLE SPNL 20GX3.5 QUINCKE YW (NEEDLE) ×2 IMPLANT
NS IRRIG 1000ML POUR BTL (IV SOLUTION) ×2 IMPLANT
PACK LAMINECTOMY NEURO (CUSTOM PROCEDURE TRAY) ×2 IMPLANT
PAD ARMBOARD 7.5X6 YLW CONV (MISCELLANEOUS) ×6 IMPLANT
PIN MAYFIELD SKULL DISP (PIN) ×2 IMPLANT
ROD SPINE POST 3.5X80 (Rod) ×4 IMPLANT
RUBBERBAND STERILE (MISCELLANEOUS) IMPLANT
SCREW ELLIPSE 14X3.5MM (Screw) ×12 IMPLANT
SPONGE LAP 4X18 X RAY DECT (DISPOSABLE) IMPLANT
SPONGE SURGIFOAM ABS GEL 100 (HEMOSTASIS) ×2 IMPLANT
STRIP CLOSURE SKIN 1/2X4 (GAUZE/BANDAGES/DRESSINGS) ×2 IMPLANT
SUT ETHILON 4 0 PS 2 18 (SUTURE) IMPLANT
SUT VIC AB 0 CT1 18XCR BRD8 (SUTURE) ×1 IMPLANT
SUT VIC AB 0 CT1 8-18 (SUTURE) ×1
SUT VIC AB 2-0 CT1 18 (SUTURE) ×4 IMPLANT
SUT VICRYL 4-0 PS2 18IN ABS (SUTURE) ×2 IMPLANT
SYR 20ML ECCENTRIC (SYRINGE) ×2 IMPLANT
TOWEL OR 17X24 6PK STRL BLUE (TOWEL DISPOSABLE) ×2 IMPLANT
TOWEL OR 17X26 10 PK STRL BLUE (TOWEL DISPOSABLE) ×2 IMPLANT
TRAY FOLEY CATH 14FRSI W/METER (CATHETERS) IMPLANT
WATER STERILE IRR 1000ML POUR (IV SOLUTION) ×2 IMPLANT

## 2014-02-10 NOTE — Anesthesia Procedure Notes (Signed)
Procedure Name: Intubation Date/Time: 02/10/2014 11:50 AM Performed by: Jacob Moores Pre-anesthesia Checklist: Patient identified, Emergency Drugs available, Suction available and Patient being monitored Patient Re-evaluated:Patient Re-evaluated prior to inductionOxygen Delivery Method: Circle system utilized Preoxygenation: Pre-oxygenation with 100% oxygen Intubation Type: IV induction Ventilation: Mask ventilation without difficulty Grade View: Grade I Tube type: Oral Tube size: 7.5 mm Number of attempts: 1 Airway Equipment and Method: LTA kit utilized and Video-laryngoscopy Placement Confirmation: ETT inserted through vocal cords under direct vision,  positive ETCO2 and breath sounds checked- equal and bilateral Secured at: 20 cm Tube secured with: Tape Dental Injury: Teeth and Oropharynx as per pre-operative assessment

## 2014-02-10 NOTE — Anesthesia Preprocedure Evaluation (Addendum)
Anesthesia Evaluation  Patient identified by MRN, date of birth, ID band Patient awake    Reviewed: Allergy & Precautions, H&P , NPO status , Patient's Chart, lab work & pertinent test results  History of Anesthesia Complications Negative for: history of anesthetic complications  Airway Mallampati: III TM Distance: >3 FB Neck ROM: Limited    Dental  (+) Dental Advisory Given, Teeth Intact   Pulmonary asthma , former smoker,  breath sounds clear to auscultation        Cardiovascular + Peripheral Vascular Disease and DVT Rhythm:Regular Rate:Normal     Neuro/Psych  Headaches, PSYCHIATRIC DISORDERS Anxiety Depression    GI/Hepatic Neg liver ROS, hiatal hernia, GERD-  Medicated,  Endo/Other  Morbid obesity  Renal/GU negative Renal ROS     Musculoskeletal  (+) Arthritis -, Fibromyalgia -  Abdominal   Peds  Hematology negative hematology ROS (+)   Anesthesia Other Findings   Reproductive/Obstetrics                          Anesthesia Physical Anesthesia Plan  ASA: II  Anesthesia Plan: General   Post-op Pain Management:    Induction: Intravenous  Airway Management Planned: Oral ETT  Additional Equipment:   Intra-op Plan:   Post-operative Plan: Extubation in OR  Informed Consent: I have reviewed the patients History and Physical, chart, labs and discussed the procedure including the risks, benefits and alternatives for the proposed anesthesia with the patient or authorized representative who has indicated his/her understanding and acceptance.   Dental advisory given  Plan Discussed with: CRNA, Anesthesiologist and Surgeon  Anesthesia Plan Comments:        Anesthesia Quick Evaluation

## 2014-02-10 NOTE — Progress Notes (Signed)
ANTIBIOTIC CONSULT NOTE - INITIAL  Pharmacy Consult for Vancomycin Indication: post -op  Allergies  Allergen Reactions  . Augmentin [Amoxicillin-Pot Clavulanate] Anaphylaxis  . Ciprofloxacin Anaphylaxis  . Lyrica [Pregabalin] Anaphylaxis  . Adhesive [Tape] Rash  . Gabapentin Swelling  . Imuran [Azathioprine] Rash    Patient Measurements: Weight: 228 lb (103.42 kg)   Vital Signs: Temp: 97.8 F (36.6 C) (09/21 1557) Temp src: Oral (09/21 1000) BP: 130/73 mmHg (09/21 1557) Pulse Rate: 88 (09/21 1557) Intake/Output from previous day:   Intake/Output from this shift: Total I/O In: 1200 [I.V.:1200] Out: 50 [Blood:50]  Labs: No results found for this basename: WBC, HGB, PLT, LABCREA, CREATININE,  in the last 72 hours The CrCl is unknown because both a height and weight (above a minimum accepted value) are required for this calculation. No results found for this basename: VANCOTROUGH, Leodis Binet, VANCORANDOM, GENTTROUGH, GENTPEAK, GENTRANDOM, TOBRATROUGH, TOBRAPEAK, TOBRARND, AMIKACINPEAK, AMIKACINTROU, AMIKACIN,  in the last 72 hours   Microbiology: Recent Results (from the past 720 hour(s))  SURGICAL PCR SCREEN     Status: Abnormal   Collection Time    02/03/14  9:38 AM      Result Value Ref Range Status   MRSA, PCR NEGATIVE  NEGATIVE Final   Staphylococcus aureus POSITIVE (*) NEGATIVE Final   Comment:            The Xpert SA Assay (FDA     approved for NASAL specimens     in patients over 62 years of age),     is one component of     a comprehensive surveillance     program.  Test performance has     been validated by [employer] for patients greater     than or equal to 92 year old.     It is not intended     to diagnose infection nor to     guide or monitor treatment.    Medical History: Past Medical History  Diagnosis Date  . Lupus   . Hyperlipemia   . Family history of anesthesia complication     Mother N/V  . Peripheral vascular disease     on  Plental  . Anxiety   . Mental disorder   . GERD (gastroesophageal reflux disease)   . Hemorrhoid   . H/O hiatal hernia   . Fibromyalgia   . Arthritis   . DVT (deep venous thrombosis) 2012    post op  . Pleurisy   . Costochondritis   . Asthma     mild - no inhaler use in last 3 months  . Depression   . Headache(784.0)   . Migraines   . Anemia     takes iron     Assessment: 48yof admitted for posterior cervical fusion.  Neck drain left in.  Afebrile, WBC wnl pre-op, good renal function.  Will use vancomycin as post op prophylaxis d/t PCN allergy.     Goal of Therapy:  Vancomycin trough level 10-15 mcg/ml  Plan:  Vancomycin  q12hr starting at 2200 tonight  Leota Sauers Pharm.D. CPP, BCPS Clinical Pharmacist [phone] 02/10/2014 5:16 PM

## 2014-02-10 NOTE — Progress Notes (Signed)
Report given to Rebecca RN.

## 2014-02-10 NOTE — Anesthesia Postprocedure Evaluation (Signed)
Anesthesia Post Note  Patient: Dana Bond  Procedure(s) Performed: Procedure(s) (LRB): POSTERIOR CERVICAL FUSION/FORAMINOTOMY CERVICAL FIVE-SIX,CERVICAL SIX-,SEVEN (N/A)  Anesthesia type: General  Patient location: PACU  Post pain: Pain level controlled and Adequate analgesia  Post assessment: Post-op Vital signs reviewed, Patient's Cardiovascular Status Stable, Respiratory Function Stable, Patent Airway and Pain level controlled  Last Vitals:  Filed Vitals:   02/10/14 1539  BP:   Pulse: 95  Temp: 36.2 C  Resp: 11    Post vital signs: Reviewed and stable  Level of consciousness: awake, alert  and oriented  Complications: No apparent anesthesia complications

## 2014-02-10 NOTE — H&P (Signed)
Dana Bond is an 48 y.o. female.   Chief Complaint: Neck and bilateral arm pain HPI: Patient is a 48 year female in the come previous anterior cervical discectomies and fusion who initially did very well over last weeks and months is a progress worsening neck pain refractory to all forms of conservative treatment with therapy anti-inflammatories and narcotic pain management. 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  Past Medical History  Diagnosis Date  . Lupus   . Hyperlipemia   . Family history of anesthesia complication     Mother N/V  . Peripheral vascular disease     on Plental  . Anxiety   . Mental disorder   . GERD (gastroesophageal reflux disease)   . Hemorrhoid   . H/O hiatal hernia   . Fibromyalgia   . Arthritis   . DVT (deep venous thrombosis) 2012    post op  . Pleurisy   . Costochondritis   . Asthma     mild - no inhaler use in last 3 months  . Depression   . Headache(784.0)   . Migraines   . Anemia     takes iron    Past Surgical History  Procedure Laterality Date  . Abdominal hysterectomy      Partial  . Cholecystectomy    . Tonsillectomy      and adneodis  . Appendectomy    . Cervical spine surgery      ACDF x 2  . Back surgery      lower disectomy  . Foot surgery Right     nerve removal  . Carpal tunnel release Right   . Lumbar laminectomy/decompression microdiscectomy Right 05/29/2013    Procedure: LUMBAR LAMINECTOMY/DECOMPRESSION MICRODISCECTOMY 1 LEVEL;  Surgeon: Mariam Dollar, MD;  Location: [facility];  Service: Neurosurgery;  Laterality: Right;  LUMBAR  LAMINECTOMY/DECOMPRESSION MICRODISCECTOMY 1 LEVEL    No family history on file. Social History:  reports that she has quit smoking. She does not have any smokeless tobacco history on file. She reports that she does not drink alcohol or use illicit drugs.  Allergies:  Allergies  Allergen Reactions  . Augmentin [Amoxicillin-Pot Clavulanate] Anaphylaxis  . Ciprofloxacin Anaphylaxis  . Lyrica [Pregabalin] Anaphylaxis  . Adhesive [Tape] Rash  . Gabapentin Swelling  . Imuran [Azathioprine] Rash    Medications Prior to Admission  Medication Sig Dispense Refill  . albuterol (PROVENTIL HFA;VENTOLIN HFA) 108 (90 BASE) MCG/ACT inhaler Inhale 1 puff into the lungs every 6 (six) hours as needed for wheezing or shortness of breath.      . ALPRAZolam (XANAX) 1 MG tablet Take 1 mg by mouth 2 (two) times daily as needed for anxiety.      Marland Kitchen atorvastatin (LIPITOR) 20 MG tablet Take 20 mg by mouth daily.      . carisoprodol (SOMA) 350 MG tablet Take 350 mg by mouth 3 (three) times daily as needed for muscle spasms.      . chlorhexidine (PERIDEX) 0.12 % solution Use as directed 15 mLs in the mouth or throat 2 (two) times daily.      . cholecalciferol (VITAMIN D) 1000 UNITS tablet Take 1,000 Units  by mouth daily.      . colchicine 0.6 MG tablet Take 0.6 mg by mouth daily.      . DULoxetine (CYMBALTA) 60 MG capsule Take 60 mg by mouth 2 (two) times daily.      Marland Kitchen esomeprazole (NEXIUM) 40 MG capsule Take 40 mg by mouth 2 (two) times daily before a meal.      . ferrous sulfate 325 (65 FE) MG tablet Take 325 mg by mouth daily with breakfast.      . folic acid (FOLVITE) 1 MG tablet Take 1 mg by mouth daily.      . hydrocortisone (ANUSOL-HC) 25 MG suppository Place 25 mg rectally 2 (two) times daily as needed for hemorrhoids.      . hydroxychloroquine (PLAQUENIL) 200 MG tablet Take 400 mg by mouth daily.       . methotrexate (50 MG/ML) 1 G injection Inject 25 mg into the muscle once a week.       . ondansetron  (ZOFRAN) 8 MG tablet Take 8 mg by mouth every 8 (eight) hours as needed for nausea or vomiting.      . Oxycodone HCl (ROXICODONE) 10 MG TABS Take 1 tablet (10 mg total) by mouth every 4 (four) hours as needed for severe pain.  80 tablet  0  . pentoxifylline (TRENTAL) 400 MG CR tablet Take 400 mg by mouth 2 (two) times daily.      . predniSONE (DELTASONE) 5 MG tablet Take 5 mg by mouth daily with breakfast.      . traZODone (DESYREL) 100 MG tablet Take 100 mg by mouth at bedtime as needed for sleep.        No results found for this or any previous visit (from the past 48 hour(s)). No results found.  Review of Systems  Constitutional: Negative.   Eyes: Negative.   Respiratory: Negative.   Cardiovascular: Negative.   Gastrointestinal: Negative.   Genitourinary: Negative.   Musculoskeletal: Positive for back pain, myalgias and neck pain.  Skin: Negative.   Psychiatric/Behavioral: Negative.     Blood pressure 138/83, pulse 90, temperature 97 F (36.1 C), temperature source Oral, resp. rate 20, weight 103.42 kg (228 lb), SpO2 100.00%. Physical Exam  Constitutional: She is oriented to person, place, and time. She appears well-developed and well-nourished.  HENT:  Head: Normocephalic.  Eyes: Pupils are equal, round, and reactive to light.  Neck: Normal range of motion.  Respiratory: Effort normal.  GI: Soft. Bowel sounds are normal.  Neurological: She is alert and oriented to person, place, and time. GCS eye subscore is 4. GCS verbal subscore is 5. GCS motor subscore is 6.  Strength is 5 out of 5 in her deltoids, biceps, triceps and wrist flexion, wrist extension, and intrinsics. Lower extremity strength is also 5 out of 5  Skin: Skin is warm and dry.     Assessment/Plan 48 and presents for posterior cervical fusion C5-C7  Kyrel Leighton P 02/10/2014, 11:23 AM

## 2014-02-10 NOTE — Progress Notes (Signed)
Pharmacy called regarding pt's post-op antibiotic. Pt allergic to a drug in the PCN family but is prescribed PCN for her post-op antibiotic. Will f/u with physician.

## 2014-02-10 NOTE — Transfer of Care (Signed)
Immediate Anesthesia Transfer of Care Note  Patient: Dana Bond  Procedure(s) Performed: Procedure(s): POSTERIOR CERVICAL FUSION/FORAMINOTOMY CERVICAL FIVE-SIX,CERVICAL SIX-,SEVEN (N/A)  Patient Location: PACU  Anesthesia Type:General  Level of Consciousness: awake, alert  and oriented  Airway & Oxygen Therapy: Patient Spontanous Breathing and Patient connected to nasal cannula oxygen  Post-op Assessment: Report given to PACU RN, Post -op Vital signs reviewed and stable and Patient moving all extremities X 4  Post vital signs: Reviewed and stable  Complications: No apparent anesthesia complications

## 2014-02-10 NOTE — Op Note (Signed)
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  EBL: Minimal  History of present illness: Patient is a very pleasant 48 year old female undergone previous anterior cervical discectomies and fusion from C3-C7 patient did very well initially however last weeks and months as had progressive worsening neck pain with occasional pain it radiates down both arms and the C7 nerve root pattern. Workup with MRI scan and CT scan plain film showed a pseudarthrosis C6-7 and probable solid fusion at C5-6 however the still some question in the facet joint so with a resolve infuse. The patient progressed clinical syndrome and her failure conservative treatment and imaging findings consistent with a pseudoarthrosis I recommended posterior lateral fusion with lateral mass screws at C5-C7. Extensive the risks and benefits of the operation with her as well as perioperative course and expectations of outcome and alternatives of surgery and she understands and agrees to proceed forward.  Operative procedure: Patient brought into the or was induced under general anesthesia positioned prone in pins in the back 7  And draped in routine sterile fashion after infiltration 10 cc lidocaine with epi a midline incision was made and Bovie light car used taken the subcutaneous tissues and subperiosteal dissections care lamina of C5-6 and 7 initial interoperative X. identify the C5-6 disc space the lateral masses were all dissected free from C5-6 and 7 pilot holes were drilled inframedial quadrant and then using a 14 mm drill bit holes were drilled on a trajectory from the inferomedial quadrant of the superior outer quadrant  initially on the right C7 lateral mass this screw hole broke out so I redirected it and had good bony purchase throughout the entirety of the whole. All holes were probed to hold it C7 were tapped and then 614 mm screws were placed all screws excellent purchase then 2-40 mm straight rods were applied top tightening nuts were tightened down anchored in place then aggressive decortication was carried out within the facet joints along the lateral masses of C5 and C7 the morsels were packed within the facet joint and overlaid along the lateral masses were also strips a medium neck drain was placed and was then closed in layers with after Vicryl the skin was closed running 4 subcuticular benzo and Steri-Strips were applied and patient went to the recovery room in stable condition. At the end of case on it counts sponge counts were correct.

## 2014-02-10 NOTE — Progress Notes (Signed)
MD contacted about post-op antibiotic. New orders received.

## 2014-02-11 ENCOUNTER — Encounter (HOSPITAL_COMMUNITY): Payer: Self-pay | Admitting: Neurosurgery

## 2014-02-11 MED ORDER — INFLUENZA VAC SPLIT QUAD 0.5 ML IM SUSY
0.5000 mL | PREFILLED_SYRINGE | INTRAMUSCULAR | Status: AC
  Administered 2014-02-12: 0.5 mL via INTRAMUSCULAR
  Filled 2014-02-11: qty 0.5

## 2014-02-11 MED ORDER — BUTALBITAL-APAP-CAFFEINE 50-325-40 MG PO TABS
2.0000 | ORAL_TABLET | Freq: Once | ORAL | Status: AC
  Administered 2014-02-11: 2 via ORAL
  Filled 2014-02-11: qty 2

## 2014-02-11 NOTE — Plan of Care (Signed)
Problem: Acute Rehab PT Goals(only PT should resolve) Goal: Pt Will Go Supine/Side To Sit Using correct body mechanics    Goal: Pt Will Transfer Bed To Chair/Chair To Bed Using LRAD

## 2014-02-11 NOTE — Evaluation (Signed)
Physical Therapy Evaluation Patient Details Name: Dana Bond MRN: [id number] DOB: [birth year] Today's Date: 02/11/2014   History of Present Illness  Patient is a 48 year female with previous anterior cervical C3-5 discectomies and fusion who initially did very well.  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  PMHx:  Lupus, PVD, DVT, anxiety, hiatal hernia, asthma, depression, HLD, CTS, lumbar laminectomy and microdiskectomy.  Clinical Impression  PT observed pt in bed and then chair with pt demonstrating limited upper body movement, very guarded.  Her positioning was beneficial to overall pain and spasm complaints as she reports this surgery was worse than her other two spine surgeries.  Pt in agreement with HHPT and will be willing to get a walker if this is her best option at DC.    Follow Up Recommendations Home health PT;Supervision/Assistance - 24 hour    Equipment Recommendations  Rolling walker with 5" wheels;Other (comment) (If needed at DC)    Recommendations for Other Services Other (comment)     Precautions / Restrictions Precautions Precautions: Cervical Precaution Comments: Wearing soft collar to protect and minimize HA complaints Required Braces or Orthoses: Cervical Brace (soft collar) Cervical Brace: Soft collar Restrictions Weight Bearing Restrictions: No      Mobility  Bed Mobility Overal bed mobility: Needs Assistance Bed Mobility: Rolling;Sidelying to Sit Rolling: Min assist Sidelying to sit: Mod assist       General bed mobility comments: Pt good with initiation in correct sequence and needed help mainly for assisting her efforts  Transfers Overall transfer level: Needs assistance Equipment used: Rolling walker (2 wheeled) Transfers: Sit to/from [employer];Anterior-Posterior Transfer Sit to Stand: Min assist Stand pivot transfers: Min guard   Anterior-Posterior transfers: Min guard;Min  assist   General transfer comment: reminders about safety with minimizing of backing  up and checking to see what distance is from her goal  Ambulation/Gait Ambulation/Gait assistance: Min guard Ambulation Distance (Feet): 200 Feet Assistive device: Rolling walker (2 wheeled) Gait Pattern/deviations: Step-through pattern;Wide base of support;Drifts right/left;Decreased stride length;Decreased step length - left;Decreased step length - right Gait velocity: reduced  Gait velocity interpretation: Below normal speed for age/gender General Gait Details: pt is minimizing the wbing through her arms although she acknowledges she needs the walker  Stairs            Wheelchair Mobility    Modified Rankin (Stroke Patients Only)       Balance Overall balance assessment: Needs assistance Sitting-balance support: Feet supported;Bilateral upper extremity supported Sitting balance-Leahy Scale: Fair   Postural control: Posterior lean Standing balance support: Bilateral upper extremity supported Standing balance-Leahy Scale: Poor Standing balance comment: close guading needed due to her limited UE use on walker, pt complaining her arms feel numb                             Pertinent Vitals/Pain Pain Assessment: 0-10 Pain Score: 8  Pain Location: headache Pain Descriptors / Indicators: Tender;Tightness Pain Intervention(s): Limited activity within patient's tolerance;Monitored during session;Patient requesting pain meds-RN notified;Ice applied;Other (comment) (chair positioning) BP was 120/72, pulse 83 and O2 sat 100% per nsg notes.    Home Living Family/patient expects to be discharged to:: Private residence Living Arrangements: Spouse/significant other Available Help at Discharge: Available 24 hours/day;Friend(s) Type of Home: House Home Access: Ramped entrance     Home Layout: One level Home Equipment: Gilmer Mor - single point Additional Comments: May benefit  from a  walker at home    Prior Function Level of Independence: Independent with assistive device(s)         Comments: Her significant other is disabled and won't be able to provide her physical assistance     Hand Dominance        Extremity/Trunk Assessment   Upper Extremity Assessment: Overall WFL for tasks assessed;Generalized weakness           Lower Extremity Assessment: Generalized weakness;Overall [facility] for tasks assessed      Cervical / Trunk Assessment: Kyphotic  Communication   Communication: No difficulties  Cognition Arousal/Alertness: Awake/alert Behavior During Therapy: WFL for tasks assessed/performed Overall Cognitive Status: Within Functional Limits for tasks assessed                      General Comments General comments (skin integrity, edema, etc.): drain is attached to her gown with clip and draining steadily.  Has some pain from HA and ice was applied to her neck with good results    Exercises        Assessment/Plan    PT Assessment Patient needs continued PT services  PT Diagnosis Difficulty walking   PT Problem List Decreased strength;Decreased activity tolerance;Decreased balance;Decreased mobility;Impaired sensation;Decreased coordination;Pain;Decreased skin integrity  PT Treatment Interventions DME instruction;Gait training;Stair training;Functional mobility training;Therapeutic activities;Therapeutic exercise;Balance training;Neuromuscular re-education;Patient/family education   PT Goals (Current goals can be found in the Care Plan section) Acute Rehab PT Goals Patient Stated Goal: to go home with her boyfriend PT Goal Formulation: With patient Time For Goal Achievement: 02/18/14 Potential to Achieve Goals: Good    Frequency Min 5X/week   Barriers to discharge Inaccessible home environment;Decreased caregiver support Her significant other will be able to provide some help but is disabled    Co-evaluation                End of Session Equipment Utilized During Treatment: Cervical collar;Other (comment) (FWW) Activity Tolerance: Patient tolerated treatment well;Patient limited by pain Patient left: in chair;with call bell/phone within reach;with nursing/sitter in room Nurse Communication: Mobility status;Patient requests pain meds         Time: 1610-9604 PT Time Calculation (min): 30 min   Charges:   PT Evaluation $Initial PT Evaluation Tier I: 1 Procedure PT Treatments $Gait Training: 8-22 mins   PT G Codes:          Ivar Drape 2014-03-10, 10:45 AM Samul Dada, PT MS Acute Rehab Dept. Number: [phone]

## 2014-02-11 NOTE — Evaluation (Addendum)
Occupational Therapy Evaluation Patient Details Name: Dana Bond MRN: [id number] DOB: [birth year] Today's Date: 02/11/2014    History of Present Illness Patient is a 48 year female with previous anterior cervical C3-5 discectomies and fusion who initially did very well.  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  PMHx:  Lupus, PVD, DVT, anxiety, hiatal hernia, asthma, depression, HLD, CTS, lumbar laminectomy and microdiskectomy.   Clinical Impression   Pt s/p above. Feel pt will benefit from acute OT to increase independence prior to d/c. Plan to practice shower transfer and address fine motor coordination and right hand strength next session.    Follow Up Recommendations  No OT follow up;Supervision - Intermittent    Equipment Recommendations  None recommended by OT    Recommendations for Other Services       Precautions / Restrictions Precautions Precautions: Cervical Precaution Comments: reviewed precautions Required Braces or Orthoses: Cervical Brace Cervical Brace: Soft collar Restrictions Weight Bearing Restrictions: No      Mobility Bed Mobility Overal bed mobility: Needs Assistance Bed Mobility: Rolling;Sidelying to Sit Rolling: Modified independent (Device/Increase time) Sidelying to sit: Min guard       General bed mobility comments: cues for technique.  Transfers Overall transfer level: Needs assistance Equipment used: Rolling walker (2 wheeled) Transfers: Sit to/from Stand Sit to Stand: Supervision/Modified Independent              Balance                                            ADL Overall ADL's : Needs assistance/impaired     Grooming: Brushing hair;Oral care;Minimal assistance;Standing               Lower Body Dressing: Set up;Supervision/safety;Sit to/from stand   Toilet Transfer: Supervision/safety;Ambulation;Comfort height toilet;RW   Toileting- Clothing  Manipulation and Hygiene: Modified independent (standing/sitting)       Functional mobility during ADLs: Supervision/safety;Rolling walker General ADL Comments: Educated on safety tips for home (safe shoewear, rugs/clutter, use of bag on walker, pets, sitting for most of LB ADLs). Recommended someone be with her for shower transfer and educated on safe technique for shower transfer. Discussed UB dressing and different clothing options they may be easier to manage. OT adjusted neck collar. Discussed how to could brush hair (OT assisted with this). Discussed activities pt can be doing to work on fine motor coordination and recommended pt to avoid sharp/hot/dangerous surfaces/objects due to decreased sensation in UE's. Discussed use of cup/straw to help avoid neck motion.     Vision                     Perception     Praxis      Pertinent Vitals/Pain Pain Assessment: 0-10 Pain Score: 8  Pain Location: neck Pain Descriptors / Indicators: Burning;Spasm Pain Intervention(s): Monitored during session     Hand Dominance     Extremity/Trunk Assessment Upper Extremity Assessment Upper Extremity Assessment: RUE deficits/detail;LUE deficits/detail RUE Deficits / Details: pain when raising right shoulder; weak grasp compared to left RUE Sensation: decreased light touch RUE Coordination: decreased fine motor LUE Sensation: decreased light touch LUE Coordination: decreased fine motor   Lower Extremity Assessment Lower Extremity Assessment: Defer to PT evaluation       Communication Communication Communication: No difficulties   Cognition Arousal/Alertness: Awake/alert Behavior  During Therapy: WFL for tasks assessed/performed Overall Cognitive Status: Within Functional Limits for tasks assessed                     General Comments       Exercises       Shoulder Instructions      Home Living Family/patient expects to be discharged to:: Private residence Living  Arrangements: Spouse/significant other Available Help at Discharge: Available 24 hours/day;Friend(s) Type of Home: House Home Access: Ramped entrance     Home Layout: One level     Bathroom Shower/Tub: Producer, television/film/video: Standard     Home Equipment: Cane - single point (has chair she can use for shower chair)          Prior Functioning/Environment Level of Independence: Independent with assistive device(s)        Comments: Her significant other is disabled and won't be able to provide her physical assistance    OT Diagnosis: Acute pain   OT Problem List: Decreased strength;Decreased activity tolerance;Decreased knowledge of use of DME or AE;Decreased knowledge of precautions;Pain;Impaired balance (sitting and/or standing)   OT Treatment/Interventions: Self-care/ADL training;Therapeutic exercise;DME and/or AE instruction;Therapeutic activities;Patient/family education;Balance training    OT Goals(Current goals can be found in the care plan section) Acute Rehab OT Goals Patient Stated Goal: not stated OT Goal Formulation: With patient Time For Goal Achievement: 02/18/14 Potential to Achieve Goals: Good ADL Goals Pt Will Perform Tub/Shower Transfer: Shower transfer;ambulating;shower seat;rolling walker Additional ADL Goal #1: Pt will be independent with HEP for bilateral UE's to increase strength and coordination.  OT Frequency: Min 2X/week   Barriers to D/C:            Co-evaluation              End of Session Equipment Utilized During Treatment: Gait belt;Rolling walker;Cervical collar Nurse Communication: Other (comment) (pain)  Activity Tolerance: Patient tolerated treatment well Patient left: in chair;with call bell/phone within reach   Time: 1449-1517 OT Time Calculation (min): 28 min Charges:  OT General Charges $OT Visit: 1 Procedure OT Evaluation $Initial OT Evaluation Tier I: 1 Procedure OT Treatments $Self Care/Home  Management : 8-22 mins G-CodesEarlie Raveling OTR/L [phone] 02/11/2014, 4:00 PM

## 2014-02-11 NOTE — Progress Notes (Signed)
UR complete.  Katianne Barre RN, MSN 

## 2014-02-11 NOTE — Progress Notes (Signed)
Subjective: Patient reports Having a lot of neck pain but no numbness tingling up into her arms.  Objective: Vital signs in last 24 hours: Temp:  [97 F (36.1 C)-98.6 F (37 C)] 98.6 F (37 C) (09/22 0702) Pulse Rate:  [77-118] 77 (09/22 0702) Resp:  [9-26] 18 (09/22 0702) BP: (112-159)/(57-90) 123/59 mmHg (09/22 0702) SpO2:  [91 %-100 %] 95 % (09/22 0702) Weight:  [103.42 kg (228 lb)] 103.42 kg (228 lb) (09/21 1000)  Intake/Output from previous day: 09/21 0701 - 09/22 0700 In: 1200 [I.V.:1200] Out: 50 [Blood:50] Intake/Output this shift:    strength 5 out of 5 wound clean dry and intact  Lab Results: No results found for this basename: WBC, HGB, HCT, PLT,  in the last 72 hours BMET No results found for this basename: NA, K, CL, CO2, GLUCOSE, BUN, CREATININE, CALCIUM,  in the last 72 hours  Studies/Results: Dg Cervical Spine 2-3 Views  02/10/2014   CLINICAL DATA:  Posterior cervical fusion  EXAM: CERVICAL SPINE - 2-3 VIEW; DG C-ARM 61-120 MIN  COMPARISON:  CT 01/25/2014  FINDINGS: Two intraoperative spot fluoroscopic views are provided. New posterior fusion from C5 through C7 with pedicle screws and posterior fusion rods. Anterior cervical fusion noted from C3-C5.  IMPRESSION: Posterior cervical fusion without radiographic complication.   Electronically Signed   By: Genevive Bi M.D.   On: 02/10/2014 13:32   Dg C-arm 1-60 Min  02/10/2014   CLINICAL DATA:  Posterior cervical fusion  EXAM: CERVICAL SPINE - 2-3 VIEW; DG C-ARM 61-120 MIN  COMPARISON:  CT 01/25/2014  FINDINGS: Two intraoperative spot fluoroscopic views are provided. New posterior fusion from C5 through C7 with pedicle screws and posterior fusion rods. Anterior cervical fusion noted from C3-C5.  IMPRESSION: Posterior cervical fusion without radiographic complication.   Electronically Signed   By: Genevive Bi M.D.   On: 02/10/2014 13:32    Assessment/Plan: We'll give her a soft collar she's having a lot  headache all try a one-time dose of Fioricet continue Valium for muscle relaxer continue Throughout the day for pain continue PT and OT  LOS: 1 day     Dana Bond 02/11/2014, 7:21 AM

## 2014-02-11 NOTE — Progress Notes (Signed)
Orthopedic Tech Progress Note Patient Details:  Dana Bond 18-Aug-1965 [id number] [employer] Type of Ortho Device: Soft collar Ortho Device/Splint Interventions: Application   Asia R Thompson 02/11/2014, 8:08 AM

## 2014-02-12 MED ORDER — HYDROMORPHONE HCL 4 MG PO TABS: 4.0000 mg | ORAL_TABLET | ORAL | Status: DC | PRN

## 2014-02-12 MED ORDER — DIAZEPAM 10 MG PO TABS: 10.0000 mg | ORAL_TABLET | Freq: Four times a day (QID) | ORAL | Status: DC | PRN

## 2014-02-12 NOTE — Progress Notes (Signed)
Patient ID: Dana Bond, female   DOB: 08-25-1965, 48 y.o.   MRN: [id number] Doing well condition muscle spasm the back of her neck but ready for discharge

## 2014-02-12 NOTE — Progress Notes (Signed)
Patient is discharged, d/c instructions and prescription given. All questions answered. Awaiting on Daughter to pick her up. All assessments remains unchanged as at now.

## 2014-02-12 NOTE — Progress Notes (Signed)
PT Cancellation Note  Patient Details Name: Dana Bond MRN: [id number] DOB: 1965-08-10   Cancelled Treatment:    Reason Eval/Treat Not Completed: Pain limiting ability to participate Pt reporting 8/10 pain this AM and inability to get OOB.  PT to follow up this PM or tomorrow as time allows.   [name] 02/12/2014, 11:57 AM

## 2014-02-12 NOTE — Discharge Instructions (Signed)
No lifting no bending no twisting no driving no riding a car less she's come back and forth to see me. Keep the incision clean dry and intact.

## 2014-02-12 NOTE — Discharge Summary (Signed)
Physician Discharge Summary  Patient ID: Dana Bond MRN: [id number] DOB/AGE: 1965-07-08 48 y.o.  Admit date: 02/10/2014 Discharge date: 02/12/2014  Admission Diagnoses: Pseudoarthrosis C6-7 C5-6  Discharge Diagnoses: Same Active Problems:   Back ache   Discharged Condition: good  Hospital Course: Patient was admitted underwent posterior several fusion with lateral mass screws from C5-C7 postop patient did very well recovered in the floor on the floor she was ambulating voiding spontaneously tolerating a regular diet and stable for discharge home on postop day 2.  Consults: Significant Diagnostic Studies: Treatments: Posterior cervical fusion C5-C7 Discharge Exam: Blood pressure 116/74, pulse 93, temperature 98.7 F (37.1 C), temperature source Oral, resp. rate 18, height 5' 8.11" (1.73 m), weight 103.42 kg (228 lb), SpO2 98.00%. Strength out of 5 wound clean dry and intact  Disposition: Home  Discharge Instructions   For home use only DME 4 wheeled rolling walker with seat    Complete by:  As directed             Medication List    TAKE these medications       albuterol 108 (90 BASE) MCG/ACT inhaler  Commonly known as:  PROVENTIL HFA;VENTOLIN HFA  Inhale 1 puff into the lungs every 6 (six) hours as needed for wheezing or shortness of breath.     ALPRAZolam 1 MG tablet  Commonly known as:  XANAX  Take 1 mg by mouth 2 (two) times daily as needed for anxiety.     atorvastatin 20 MG tablet  Commonly known as:  LIPITOR  Take 20 mg by mouth daily.     carisoprodol 350 MG tablet  Commonly known as:  SOMA  Take 350 mg by mouth 3 (three) times daily as needed for muscle spasms.     chlorhexidine 0.12 % solution  Commonly known as:  PERIDEX  Use as directed 15 mLs in the mouth or throat 2 (two) times daily.     cholecalciferol 1000 UNITS tablet  Commonly known as:  VITAMIN D  Take 1,000 Units by mouth daily.     colchicine 0.6 MG tablet  Take 0.6 mg by mouth  daily.     diazepam 10 MG tablet  Commonly known as:  VALIUM  Take 1 tablet (10 mg total) by mouth every 6 (six) hours as needed for anxiety.     DULoxetine 60 MG capsule  Commonly known as:  CYMBALTA  Take 60 mg by mouth 2 (two) times daily.     esomeprazole 40 MG capsule  Commonly known as:  NEXIUM  Take 40 mg by mouth 2 (two) times daily before a meal.     ferrous sulfate 325 (65 FE) MG tablet  Take 325 mg by mouth daily with breakfast.     folic acid 1 MG tablet  Commonly known as:  FOLVITE  Take 1 mg by mouth daily.     hydrocortisone 25 MG suppository  Commonly known as:  ANUSOL-HC  Place 25 mg rectally 2 (two) times daily as needed for hemorrhoids.     HYDROmorphone 4 MG tablet  Commonly known as:  DILAUDID  Take 1 tablet (4 mg total) by mouth every 3 (three) hours as needed for severe pain.     hydroxychloroquine 200 MG tablet  Commonly known as:  PLAQUENIL  Take 400 mg by mouth daily.     methotrexate 1 G injection  Commonly known as:  50 mg/ml  Inject 25 mg into the muscle once a week.  ondansetron 8 MG tablet  Commonly known as:  ZOFRAN  Take 8 mg by mouth every 8 (eight) hours as needed for nausea or vomiting.     Oxycodone HCl 10 MG Tabs  Commonly known as:  ROXICODONE  Take 1 tablet (10 mg total) by mouth every 4 (four) hours as needed for severe pain.     pentoxifylline 400 MG CR tablet  Commonly known as:  TRENTAL  Take 400 mg by mouth 2 (two) times daily.     predniSONE 5 MG tablet  Commonly known as:  DELTASONE  Take 5 mg by mouth daily with breakfast.     traZODone 100 MG tablet  Commonly known as:  DESYREL  Take 100 mg by mouth at bedtime as needed for sleep.      ASK your doctor about these medications       penicillin v potassium 500 MG tablet  Commonly known as:  VEETID  Take 500 mg by mouth 4 (four) times daily. For 14 days           Follow-up Information   Follow up with [facility], MD.   Specialty:  Neurosurgery    Contact information:   [address] [place] [address] [phone]       Signed: Ingris Pasquarella P 02/12/2014, 1:58 PM

## 2014-02-24 ENCOUNTER — Encounter (HOSPITAL_COMMUNITY): Payer: Self-pay | Admitting: Neurosurgery

## 2014-03-20 ENCOUNTER — Encounter (HOSPITAL_COMMUNITY): Payer: Self-pay | Admitting: Neurosurgery

## 2014-04-03 ENCOUNTER — Other Ambulatory Visit: Payer: Self-pay | Admitting: Neurosurgery

## 2014-04-10 ENCOUNTER — Inpatient Hospital Stay (HOSPITAL_COMMUNITY)
Admission: RE | Admit: 2014-04-10 | Discharge: 2014-04-10 | Disposition: A | Discharge status: Home / Self Care | Payer: BC Managed Care – PPO | Source: Ambulatory Visit

## 2014-04-10 NOTE — Progress Notes (Signed)
Spoke with Erie NoeVanessa, CMA at Dr. Dola Argylerams office regarding pt pre-op instructions for pentoxifylline (TRENTAL) 400 MG CR tablet administration; according to Erie NoeVanessa, " Dr.Cram said for the pt to stop taking the medication."

## 2014-04-15 NOTE — Pre-Procedure Instructions (Addendum)
Dana EdisMichelle C Bond  04/15/2014   Your procedure is scheduled on:  Wednesday, April 23, 2014  [facility] at 9:45 AM.  Call this number if you have problems the morning of surgery: [phone]   Remember:   Do not eat food or drink liquids after midnight Tuesday, April 22, 2014   Take these medicines the morning of surgery with A SIP OF WATER: colchicine, DULoxetine (CYMBALTA), esomeprazole (NEXIUM), hydroxychloroquine (PLAQUENIL),  if needed:Oxycodone HCl (ROXICODONE) for pain, ondansetron (ZOFRAN)  for nausea or vomiting,  ALPRAZolam (XANAX) for anxiety, albuterol (PROVENTIL HFA;VENTOLIN inhaler for wheezing or shortness of breath ( bring inhaler with you on day of procedure).    Take all meds as ordered until day of surgery except as instructed below or per dr   Stop taking Aspirin, vitamins, and herbal medications. Do not take any NSAIDs ie: Ibuprofen, Advil, Naproxen or any medication containing Aspirin;stop 5 days prior to procedure (Friday, April 18, 2014).   Do not wear jewelry, make-up or nail polish.  Do not wear lotions, powders, or perfumes. You may not wear deodorant.  Do not shave 48 hours prior to surgery.   Do not bring valuables to the hospital.  [facility] any belongings or valuables.               Contacts, dentures or bridgework may not be worn into surgery.  Leave suitcase in the car. After surgery it may be brought to your room.  For patients admitted to the hospital, discharge time is determined by your treatment team.               Patients discharged the day of surgery will not be allowed to drive home.  Name and phone number of your driver:   Special Instructions:  Special Instructions:Special Instructions: [facility] - Pre[facility]  Before surgery, you can play an important role.  Because skin is not sterile, your skin needs to be as free of germs as possible.  You can reduce the number  of germs on you skin by washing with CHG (chlorahexidine gluconate) soap before surgery.  CHG is an antiseptic cleaner which kills germs and bonds with the skin to continue killing germs even after washing.  Please DO NOT use if you have an allergy to CHG or antibacterial soaps.  If your skin becomes reddened/irritated stop using the CHG and inform your nurse when you arrive at Short Stay.  Do not shave (including legs and underarms) for at least 48 hours prior to the first CHG shower.  You may shave your face.  Please follow these instructions carefully:   1.  Shower with CHG Soap the night before surgery and the morning of Surgery.  2.  If you choose to wash your hair, wash your hair first as usual with your normal shampoo.  3.  After you shampoo, rinse your hair and body thoroughly to remove the Shampoo.  4.  Use CHG as you would any other liquid soap.  You can apply chg directly  to the skin and wash gently with scrungie or a clean washcloth.  5.  Apply the CHG Soap to your body ONLY FROM THE NECK DOWN.  Do not use on open wounds or open sores.  Avoid contact with your eyes, ears, mouth and genitals (private parts).  Wash genitals (private parts) with your normal soap.  6.  Wash thoroughly, paying special attention to the area where  your surgery will be performed.  7.  Thoroughly rinse your body with warm water from the neck down.  8.  DO NOT shower/wash with your normal soap after using and rinsing off the CHG Soap.  9.  Pat yourself dry with a clean towel.            10.  Wear clean pajamas.            11.  Place clean sheets on your bed the night of your first shower and do not sleep with pets.  Day of Surgery  Do not apply any lotions/deodorants the morning of surgery.  Please wear clean clothes to the hospital/surgery center.   Please read over the following fact sheets that you were given: Pain Booklet, Coughing and Deep Breathing, MRSA Information and Surgical Site Infection  Prevention

## 2014-04-16 ENCOUNTER — Encounter (HOSPITAL_COMMUNITY): Payer: Self-pay

## 2014-04-16 ENCOUNTER — Encounter (HOSPITAL_COMMUNITY)
Admission: RE | Admit: 2014-04-16 | Discharge: 2014-04-16 | Disposition: A | Discharge status: Home / Self Care | Payer: BC Managed Care – PPO | Source: Ambulatory Visit | Attending: Neurosurgery | Admitting: Neurosurgery

## 2014-04-16 DIAGNOSIS — Z981 Arthrodesis status: Secondary | ICD-10-CM | POA: Diagnosis not present

## 2014-04-16 DIAGNOSIS — Z6835 Body mass index (BMI) 35.0-35.9, adult: Secondary | ICD-10-CM | POA: Insufficient documentation

## 2014-04-16 DIAGNOSIS — M329 Systemic lupus erythematosus, unspecified: Secondary | ICD-10-CM | POA: Diagnosis not present

## 2014-04-16 DIAGNOSIS — Z86718 Personal history of other venous thrombosis and embolism: Secondary | ICD-10-CM | POA: Diagnosis not present

## 2014-04-16 DIAGNOSIS — D649 Anemia, unspecified: Secondary | ICD-10-CM | POA: Insufficient documentation

## 2014-04-16 DIAGNOSIS — R091 Pleurisy: Secondary | ICD-10-CM | POA: Insufficient documentation

## 2014-04-16 DIAGNOSIS — Z01818 Encounter for other preprocedural examination: Secondary | ICD-10-CM | POA: Diagnosis present

## 2014-04-16 DIAGNOSIS — E785 Hyperlipidemia, unspecified: Secondary | ICD-10-CM | POA: Diagnosis not present

## 2014-04-16 HISTORY — DX: Pneumonia, unspecified organism: J18.9

## 2014-04-16 LAB — CBC
HCT: 38 % (ref 36.0–46.0)
HEMOGLOBIN: 12.2 g/dL (ref 12.0–15.0)
MCH: 27.4 pg (ref 26.0–34.0)
MCHC: 32.1 g/dL (ref 30.0–36.0)
MCV: 85.4 fL (ref 78.0–100.0)
Platelets: 281 10*3/uL (ref 150–400)
RBC: 4.45 MIL/uL (ref 3.87–5.11)
RDW: 14.8 % (ref 11.5–15.5)
WBC: 7.5 10*3/uL (ref 4.0–10.5)

## 2014-04-16 LAB — ABO/RH: ABO/RH(D): A NEG

## 2014-04-16 LAB — BASIC METABOLIC PANEL
ANION GAP: 12 (ref 5–15)
BUN: 10 mg/dL (ref 6–23)
CO2: 24 mEq/L (ref 19–32)
Calcium: 9 mg/dL (ref 8.4–10.5)
Chloride: 104 mEq/L (ref 96–112)
Creatinine, Ser: 0.72 mg/dL (ref 0.50–1.10)
GFR calc non Af Amer: >90 mL/min (ref >90)
Glucose, Bld: 92 mg/dL (ref 70–99)
POTASSIUM: 3.9 meq/L (ref 3.7–5.3)
Sodium: 140 mEq/L (ref 137–147)

## 2014-04-16 LAB — TYPE AND SCREEN
ABO/RH(D): A NEG
Antibody Screen: NEGATIVE

## 2014-04-16 LAB — SURGICAL PCR SCREEN
MRSA, PCR: NEGATIVE
STAPHYLOCOCCUS AUREUS: POSITIVE — AB

## 2014-04-16 NOTE — Progress Notes (Signed)
Anesthesia Chart Review:  Pt is 48 year old female scheduled for C5-6, C6-7, C7-T1 posterior cervical laminectomy on 04/23/2014.   PMH: Hyperlipidemia, DVT 2012, anemia, lupus, pleurisy. S/p redo lumbar laminectomy microdiscectomy L5-S1 on 05/29/2013, s/p posterior cervical fusion/formaminotomy C5-6, C6-7 on 02/10/2014. BMI 35  Medications include: Trental, plaquenil, soma. PAT Rn's notes indicate Trental to be stopped 04/18/2014 prior to surgery.   Preoperative labs reviewed.    Chest x-ray reviewed. No active cardiopulmonary disease.   EKG: NSR. Cannot rule out anterior infarct, age undetermined. Besides rate change, appears unchanged from previous on 05/28/2013.    Stress echo 02/23/2009 (report can be found in media tab labeled correspondence 05/30/2013): Normal hyperkinetic response in all LV wall segments noted. Normal stress echo.    If no changes, I anticipate pt can proceed with surgery as scheduled.   Rica Mastngela Winter Trefz, FNP-BC [facility]Anesthesiology Phone: [phone] 04/16/2014 2:59 PM

## 2014-04-16 NOTE — Progress Notes (Signed)
req'd notes, ekg from pcp dr Herbert Setaheather [facility]. No cardiac dr or tests per patient

## 2014-04-22 MED ORDER — VANCOMYCIN HCL 10 G IV SOLR
1500.0000 mg | INTRAVENOUS | Status: AC
  Administered 2014-04-23: 1500 mg via INTRAVENOUS
  Filled 2014-04-22: qty 1500

## 2014-04-22 NOTE — Progress Notes (Signed)
Message left about time change .instructed to arrive at 1025.

## 2014-04-23 ENCOUNTER — Inpatient Hospital Stay (HOSPITAL_COMMUNITY): Payer: BC Managed Care – PPO | Admitting: Anesthesiology

## 2014-04-23 ENCOUNTER — Encounter (HOSPITAL_COMMUNITY)
Admission: RE | Disposition: A | Discharge status: Home / Self Care | Payer: Self-pay | Source: Ambulatory Visit | Attending: Neurosurgery

## 2014-04-23 ENCOUNTER — Inpatient Hospital Stay (HOSPITAL_COMMUNITY)
Admission: RE | Admit: 2014-04-23 | Discharge: 2014-04-24 | DRG: 473 | Disposition: A | Discharge status: Home / Self Care | Payer: BC Managed Care – PPO | Source: Ambulatory Visit | Attending: Neurosurgery | Admitting: Neurosurgery

## 2014-04-23 ENCOUNTER — Inpatient Hospital Stay (HOSPITAL_COMMUNITY): Payer: BC Managed Care – PPO

## 2014-04-23 ENCOUNTER — Inpatient Hospital Stay (HOSPITAL_COMMUNITY): Payer: BC Managed Care – PPO | Admitting: Vascular Surgery

## 2014-04-23 ENCOUNTER — Encounter (HOSPITAL_COMMUNITY): Payer: Self-pay | Admitting: Anesthesiology

## 2014-04-23 DIAGNOSIS — Z90710 Acquired absence of both cervix and uterus: Secondary | ICD-10-CM

## 2014-04-23 DIAGNOSIS — E785 Hyperlipidemia, unspecified: Secondary | ICD-10-CM | POA: Diagnosis present

## 2014-04-23 DIAGNOSIS — Z87891 Personal history of nicotine dependence: Secondary | ICD-10-CM | POA: Diagnosis not present

## 2014-04-23 DIAGNOSIS — K219 Gastro-esophageal reflux disease without esophagitis: Secondary | ICD-10-CM | POA: Diagnosis present

## 2014-04-23 DIAGNOSIS — J45909 Unspecified asthma, uncomplicated: Secondary | ICD-10-CM | POA: Diagnosis present

## 2014-04-23 DIAGNOSIS — Z86718 Personal history of other venous thrombosis and embolism: Secondary | ICD-10-CM | POA: Diagnosis not present

## 2014-04-23 DIAGNOSIS — M940 Chondrocostal junction syndrome [Tietze]: Secondary | ICD-10-CM | POA: Diagnosis present

## 2014-04-23 DIAGNOSIS — Z981 Arthrodesis status: Secondary | ICD-10-CM | POA: Diagnosis not present

## 2014-04-23 DIAGNOSIS — I739 Peripheral vascular disease, unspecified: Secondary | ICD-10-CM | POA: Diagnosis present

## 2014-04-23 DIAGNOSIS — G43909 Migraine, unspecified, not intractable, without status migrainosus: Secondary | ICD-10-CM | POA: Diagnosis present

## 2014-04-23 DIAGNOSIS — F419 Anxiety disorder, unspecified: Secondary | ICD-10-CM | POA: Diagnosis present

## 2014-04-23 DIAGNOSIS — G8929 Other chronic pain: Secondary | ICD-10-CM

## 2014-04-23 DIAGNOSIS — M960 Pseudarthrosis after fusion or arthrodesis: Principal | ICD-10-CM | POA: Diagnosis present

## 2014-04-23 DIAGNOSIS — D649 Anemia, unspecified: Secondary | ICD-10-CM | POA: Diagnosis present

## 2014-04-23 DIAGNOSIS — S129XXA Fracture of neck, unspecified, initial encounter: Secondary | ICD-10-CM | POA: Diagnosis present

## 2014-04-23 DIAGNOSIS — M542 Cervicalgia: Secondary | ICD-10-CM

## 2014-04-23 HISTORY — PX: POSTERIOR CERVICAL FUSION/FORAMINOTOMY: SHX5038

## 2014-04-23 SURGERY — POSTERIOR CERVICAL FUSION/FORAMINOTOMY LEVEL 3
Anesthesia: General | Site: Spine Cervical

## 2014-04-23 MED ORDER — SODIUM CHLORIDE 0.9 % IJ SOLN: 3.0000 mL | INTRAMUSCULAR | Status: DC | PRN

## 2014-04-23 MED ORDER — ARTIFICIAL TEARS OP OINT
TOPICAL_OINTMENT | OPHTHALMIC | Status: AC
Start: 2014-04-23 — End: 2014-04-23
  Filled 2014-04-23: qty 3.5

## 2014-04-23 MED ORDER — ACETAMINOPHEN 325 MG PO TABS: 650.0000 mg | ORAL_TABLET | ORAL | Status: DC | PRN

## 2014-04-23 MED ORDER — DOCUSATE SODIUM 100 MG PO CAPS
100.0000 mg | ORAL_CAPSULE | Freq: Two times a day (BID) | ORAL | Status: DC
  Administered 2014-04-23: 100 mg via ORAL
  Filled 2014-04-23: qty 1

## 2014-04-23 MED ORDER — PROMETHAZINE HCL 25 MG/ML IJ SOLN
6.2500 mg | INTRAMUSCULAR | Status: DC | PRN
  Administered 2014-04-23: 6.25 mg via INTRAVENOUS

## 2014-04-23 MED ORDER — ATORVASTATIN CALCIUM 10 MG PO TABS: 20.0000 mg | ORAL_TABLET | Freq: Every day | ORAL | Status: DC

## 2014-04-23 MED ORDER — FOLIC ACID 1 MG PO TABS
1.0000 mg | ORAL_TABLET | Freq: Every day | ORAL | Status: DC
  Administered 2014-04-23: 1 mg via ORAL
  Filled 2014-04-23: qty 1

## 2014-04-23 MED ORDER — OXYCODONE-ACETAMINOPHEN 5-325 MG PO TABS
1.0000 | ORAL_TABLET | ORAL | Status: DC | PRN
  Administered 2014-04-24: 2 via ORAL
  Filled 2014-04-23: qty 2

## 2014-04-23 MED ORDER — GELATIN ABSORBABLE MT POWD
OROMUCOSAL | Status: DC | PRN
  Administered 2014-04-23: 15:00:00 via TOPICAL

## 2014-04-23 MED ORDER — HYDROCORTISONE ACETATE 25 MG RE SUPP
25.0000 mg | Freq: Two times a day (BID) | RECTAL | Status: DC | PRN
  Filled 2014-04-23: qty 1

## 2014-04-23 MED ORDER — NEOSTIGMINE METHYLSULFATE 10 MG/10ML IV SOLN
INTRAVENOUS | Status: AC
  Filled 2014-04-23: qty 1

## 2014-04-23 MED ORDER — FENTANYL CITRATE 0.05 MG/ML IJ SOLN
INTRAMUSCULAR | Status: DC | PRN
  Administered 2014-04-23: 100 ug via INTRAVENOUS
  Administered 2014-04-23: 50 ug via INTRAVENOUS
  Administered 2014-04-23: 100 ug via INTRAVENOUS
  Administered 2014-04-23 (×5): 50 ug via INTRAVENOUS

## 2014-04-23 MED ORDER — ONDANSETRON HCL 4 MG/2ML IJ SOLN: 4.0000 mg | Freq: Four times a day (QID) | INTRAMUSCULAR | Status: DC | PRN

## 2014-04-23 MED ORDER — MUPIROCIN 2 % EX OINT
1.0000 "application " | TOPICAL_OINTMENT | Freq: Two times a day (BID) | CUTANEOUS | Status: DC
  Administered 2014-04-23: 1 via NASAL
  Filled 2014-04-23: qty 22

## 2014-04-23 MED ORDER — PROPOFOL 10 MG/ML IV BOLUS
INTRAVENOUS | Status: DC | PRN
  Administered 2014-04-23: 50 mg via INTRAVENOUS
  Administered 2014-04-23: 30 mg via INTRAVENOUS
  Administered 2014-04-23: 20 mg via INTRAVENOUS
  Administered 2014-04-23: 150 mg via INTRAVENOUS

## 2014-04-23 MED ORDER — DULOXETINE HCL 60 MG PO CPEP
60.0000 mg | ORAL_CAPSULE | Freq: Two times a day (BID) | ORAL | Status: DC
  Administered 2014-04-23: 60 mg via ORAL
  Filled 2014-04-23: qty 1

## 2014-04-23 MED ORDER — PROPOFOL 10 MG/ML IV BOLUS
INTRAVENOUS | Status: AC
  Filled 2014-04-23: qty 20

## 2014-04-23 MED ORDER — DIPHENHYDRAMINE HCL 50 MG/ML IJ SOLN: 12.5000 mg | Freq: Four times a day (QID) | INTRAMUSCULAR | Status: DC | PRN

## 2014-04-23 MED ORDER — TRAZODONE HCL 100 MG PO TABS: 100.0000 mg | ORAL_TABLET | Freq: Every evening | ORAL | Status: DC | PRN

## 2014-04-23 MED ORDER — STERILE WATER FOR INJECTION IJ SOLN
INTRAMUSCULAR | Status: AC
  Filled 2014-04-23: qty 10

## 2014-04-23 MED ORDER — BUPIVACAINE HCL (PF) 0.25 % IJ SOLN
INTRAMUSCULAR | Status: DC | PRN
  Administered 2014-04-23: 10 mL

## 2014-04-23 MED ORDER — PROMETHAZINE HCL 25 MG/ML IJ SOLN
INTRAMUSCULAR | Status: AC
  Filled 2014-04-23: qty 1

## 2014-04-23 MED ORDER — HYDROMORPHONE 0.3 MG/ML IV SOLN
INTRAVENOUS | Status: DC
  Administered 2014-04-23: 18:00:00 via INTRAVENOUS
  Administered 2014-04-24: 2.7 mg via INTRAVENOUS
  Administered 2014-04-24: 2.1 mg via INTRAVENOUS
  Administered 2014-04-24: 04:00:00 via INTRAVENOUS
  Filled 2014-04-23: qty 25

## 2014-04-23 MED ORDER — SODIUM CHLORIDE 0.9 % IR SOLN
Status: DC | PRN
  Administered 2014-04-23: 15:00:00

## 2014-04-23 MED ORDER — CEFAZOLIN SODIUM 1-5 GM-% IV SOLN: 1.0000 g | Freq: Three times a day (TID) | INTRAVENOUS | Status: DC

## 2014-04-23 MED ORDER — GLYCOPYRROLATE 0.2 MG/ML IJ SOLN
INTRAMUSCULAR | Status: AC
Start: 2014-04-23 — End: 2014-04-23
  Filled 2014-04-23: qty 3

## 2014-04-23 MED ORDER — DEXAMETHASONE SODIUM PHOSPHATE 10 MG/ML IJ SOLN
INTRAMUSCULAR | Status: AC
  Administered 2014-04-23: 10 mg via INTRAVENOUS
  Filled 2014-04-23: qty 1

## 2014-04-23 MED ORDER — SODIUM CHLORIDE 0.9 % IJ SOLN: 9.0000 mL | INTRAMUSCULAR | Status: DC | PRN

## 2014-04-23 MED ORDER — OXYCODONE HCL 5 MG/5ML PO SOLN: 5.0000 mg | Freq: Once | ORAL | Status: DC | PRN

## 2014-04-23 MED ORDER — VANCOMYCIN HCL IN DEXTROSE 1-5 GM/200ML-% IV SOLN
1000.0000 mg | Freq: Two times a day (BID) | INTRAVENOUS | Status: DC
Start: 2014-04-24 — End: 2014-04-24
  Administered 2014-04-24: 1000 mg via INTRAVENOUS
  Filled 2014-04-23 (×2): qty 200

## 2014-04-23 MED ORDER — HYDROMORPHONE HCL 1 MG/ML IJ SOLN
INTRAMUSCULAR | Status: AC
  Filled 2014-04-23: qty 2

## 2014-04-23 MED ORDER — HYDROXYCHLOROQUINE SULFATE 200 MG PO TABS
400.0000 mg | ORAL_TABLET | Freq: Every day | ORAL | Status: DC
  Administered 2014-04-23: 400 mg via ORAL
  Filled 2014-04-23: qty 2

## 2014-04-23 MED ORDER — THROMBIN 20000 UNITS EX SOLR
CUTANEOUS | Status: DC | PRN
  Administered 2014-04-23: 15:00:00 via TOPICAL

## 2014-04-23 MED ORDER — ALPRAZOLAM 0.5 MG PO TABS: 1.0000 mg | ORAL_TABLET | Freq: Two times a day (BID) | ORAL | Status: DC | PRN

## 2014-04-23 MED ORDER — BACITRACIN ZINC 500 UNIT/GM EX OINT
TOPICAL_OINTMENT | CUTANEOUS | Status: DC | PRN
  Administered 2014-04-23: 1 via TOPICAL

## 2014-04-23 MED ORDER — DIAZEPAM 5 MG/ML IJ SOLN
2.5000 mg | Freq: Once | INTRAMUSCULAR | Status: AC
  Administered 2014-04-23: 2.5 mg via INTRAVENOUS

## 2014-04-23 MED ORDER — CARISOPRODOL 350 MG PO TABS
350.0000 mg | ORAL_TABLET | Freq: Three times a day (TID) | ORAL | Status: DC
  Administered 2014-04-23: 350 mg via ORAL
  Filled 2014-04-23: qty 1

## 2014-04-23 MED ORDER — ALPRAZOLAM 1 MG PO TABS: 1.0000 mg | ORAL_TABLET | Freq: Two times a day (BID) | ORAL | Status: DC | PRN

## 2014-04-23 MED ORDER — ONDANSETRON HCL 4 MG/2ML IJ SOLN
INTRAMUSCULAR | Status: AC
  Filled 2014-04-23: qty 2

## 2014-04-23 MED ORDER — OXYCODONE HCL 5 MG PO TABS: 10.0000 mg | ORAL_TABLET | ORAL | Status: DC | PRN

## 2014-04-23 MED ORDER — VITAMIN D 1000 UNITS PO TABS
1000.0000 [IU] | ORAL_TABLET | Freq: Every day | ORAL | Status: DC
  Administered 2014-04-23: 1000 [IU] via ORAL
  Filled 2014-04-23: qty 1

## 2014-04-23 MED ORDER — ONDANSETRON HCL 4 MG/2ML IJ SOLN: 4.0000 mg | INTRAMUSCULAR | Status: DC | PRN

## 2014-04-23 MED ORDER — DIAZEPAM 5 MG/ML IJ SOLN
INTRAMUSCULAR | Status: AC
  Filled 2014-04-23: qty 2

## 2014-04-23 MED ORDER — PHENOL 1.4 % MT LIQD: 1.0000 | OROMUCOSAL | Status: DC | PRN

## 2014-04-23 MED ORDER — FENTANYL CITRATE 0.05 MG/ML IJ SOLN
INTRAMUSCULAR | Status: AC
  Filled 2014-04-23: qty 5

## 2014-04-23 MED ORDER — CYCLOBENZAPRINE HCL 10 MG PO TABS
10.0000 mg | ORAL_TABLET | Freq: Three times a day (TID) | ORAL | Status: DC | PRN
  Administered 2014-04-23: 10 mg via ORAL
  Filled 2014-04-23: qty 1

## 2014-04-23 MED ORDER — THROMBIN 5000 UNITS EX SOLR
CUTANEOUS | Status: AC
  Filled 2014-04-23: qty 5000

## 2014-04-23 MED ORDER — PENTOXIFYLLINE ER 400 MG PO TBCR
400.0000 mg | EXTENDED_RELEASE_TABLET | Freq: Two times a day (BID) | ORAL | Status: DC
  Administered 2014-04-23: 400 mg via ORAL
  Filled 2014-04-23 (×3): qty 1

## 2014-04-23 MED ORDER — ALUM & MAG HYDROXIDE-SIMETH 200-200-20 MG/5ML PO SUSP: 30.0000 mL | Freq: Four times a day (QID) | ORAL | Status: DC | PRN

## 2014-04-23 MED ORDER — ACETAMINOPHEN 650 MG RE SUPP: 650.0000 mg | RECTAL | Status: DC | PRN

## 2014-04-23 MED ORDER — HYDROMORPHONE HCL 1 MG/ML IJ SOLN
0.2500 mg | INTRAMUSCULAR | Status: DC | PRN
  Administered 2014-04-23 (×4): 0.5 mg via INTRAVENOUS

## 2014-04-23 MED ORDER — 0.9 % SODIUM CHLORIDE (POUR BTL) OPTIME
TOPICAL | Status: DC | PRN
  Administered 2014-04-23: 1000 mL

## 2014-04-23 MED ORDER — LIDOCAINE-EPINEPHRINE 1 %-1:100000 IJ SOLN
INTRAMUSCULAR | Status: DC | PRN
  Administered 2014-04-23: 7 mL

## 2014-04-23 MED ORDER — ROCURONIUM BROMIDE 100 MG/10ML IV SOLN
INTRAVENOUS | Status: DC | PRN
  Administered 2014-04-23: 30 mg via INTRAVENOUS
  Administered 2014-04-23: 50 mg via INTRAVENOUS

## 2014-04-23 MED ORDER — SODIUM CHLORIDE 0.9 % IV SOLN: 250.0000 mL | INTRAVENOUS | Status: DC

## 2014-04-23 MED ORDER — OXYCODONE HCL 5 MG PO TABS: 5.0000 mg | ORAL_TABLET | Freq: Once | ORAL | Status: DC | PRN

## 2014-04-23 MED ORDER — NALOXONE HCL 0.4 MG/ML IJ SOLN: 0.4000 mg | INTRAMUSCULAR | Status: DC | PRN

## 2014-04-23 MED ORDER — DIPHENHYDRAMINE HCL 12.5 MG/5ML PO ELIX: 12.5000 mg | ORAL_SOLUTION | Freq: Four times a day (QID) | ORAL | Status: DC | PRN

## 2014-04-23 MED ORDER — ROCURONIUM BROMIDE 50 MG/5ML IV SOLN
INTRAVENOUS | Status: AC
  Filled 2014-04-23: qty 1

## 2014-04-23 MED ORDER — ONDANSETRON HCL 4 MG/2ML IJ SOLN
INTRAMUSCULAR | Status: DC | PRN
  Administered 2014-04-23: 4 mg via INTRAVENOUS

## 2014-04-23 MED ORDER — PREDNISONE 5 MG PO TABS: 5.0000 mg | ORAL_TABLET | Freq: Every day | ORAL | Status: DC

## 2014-04-23 MED ORDER — NEOSTIGMINE METHYLSULFATE 10 MG/10ML IV SOLN
INTRAVENOUS | Status: DC | PRN
  Administered 2014-04-23: 4 mg via INTRAVENOUS

## 2014-04-23 MED ORDER — HYDROMORPHONE 0.3 MG/ML IV SOLN
INTRAVENOUS | Status: AC
  Filled 2014-04-23: qty 25

## 2014-04-23 MED ORDER — FERROUS SULFATE 325 (65 FE) MG PO TABS: 325.0000 mg | ORAL_TABLET | Freq: Every day | ORAL | Status: DC

## 2014-04-23 MED ORDER — GLYCOPYRROLATE 0.2 MG/ML IJ SOLN
INTRAMUSCULAR | Status: DC | PRN
  Administered 2014-04-23: .5 mg via INTRAVENOUS

## 2014-04-23 MED ORDER — COLCHICINE 0.6 MG PO TABS
0.6000 mg | ORAL_TABLET | Freq: Every day | ORAL | Status: DC
  Administered 2014-04-23: 0.6 mg via ORAL
  Filled 2014-04-23: qty 1

## 2014-04-23 MED ORDER — LIDOCAINE HCL (CARDIAC) 20 MG/ML IV SOLN
INTRAVENOUS | Status: DC | PRN
  Administered 2014-04-23: 40 mg via INTRAVENOUS

## 2014-04-23 MED ORDER — LACTATED RINGERS IV SOLN
INTRAVENOUS | Status: DC
  Administered 2014-04-23 (×2): via INTRAVENOUS

## 2014-04-23 MED ORDER — LIDOCAINE HCL (CARDIAC) 20 MG/ML IV SOLN
INTRAVENOUS | Status: AC
  Filled 2014-04-23: qty 5

## 2014-04-23 MED ORDER — ONDANSETRON HCL 4 MG PO TABS: 8.0000 mg | ORAL_TABLET | Freq: Three times a day (TID) | ORAL | Status: DC | PRN

## 2014-04-23 MED ORDER — HYDROMORPHONE HCL 1 MG/ML IJ SOLN: 0.5000 mg | INTRAMUSCULAR | Status: DC | PRN

## 2014-04-23 MED ORDER — ALBUTEROL SULFATE HFA 108 (90 BASE) MCG/ACT IN AERS: 1.0000 | INHALATION_SPRAY | Freq: Four times a day (QID) | RESPIRATORY_TRACT | Status: DC | PRN

## 2014-04-23 MED ORDER — MIDAZOLAM HCL 5 MG/5ML IJ SOLN
INTRAMUSCULAR | Status: DC | PRN
  Administered 2014-04-23: 2 mg via INTRAVENOUS

## 2014-04-23 MED ORDER — MIDAZOLAM HCL 2 MG/2ML IJ SOLN
INTRAMUSCULAR | Status: AC
  Filled 2014-04-23: qty 2

## 2014-04-23 MED ORDER — MENTHOL 3 MG MT LOZG: 1.0000 | LOZENGE | OROMUCOSAL | Status: DC | PRN

## 2014-04-23 MED ORDER — DEXAMETHASONE SODIUM PHOSPHATE 10 MG/ML IJ SOLN
INTRAMUSCULAR | Status: AC
  Filled 2014-04-23: qty 1

## 2014-04-23 MED ORDER — DEXAMETHASONE SODIUM PHOSPHATE 10 MG/ML IJ SOLN: 10.0000 mg | INTRAMUSCULAR | Status: DC

## 2014-04-23 MED ORDER — PANTOPRAZOLE SODIUM 40 MG PO TBEC: 40.0000 mg | DELAYED_RELEASE_TABLET | Freq: Every day | ORAL | Status: DC

## 2014-04-23 MED ORDER — ALBUTEROL SULFATE (2.5 MG/3ML) 0.083% IN NEBU
2.5000 mg | INHALATION_SOLUTION | Freq: Four times a day (QID) | RESPIRATORY_TRACT | Status: DC | PRN
Start: 2014-04-23 — End: 2014-04-24

## 2014-04-23 MED ORDER — SODIUM CHLORIDE 0.9 % IJ SOLN: 3.0000 mL | Freq: Two times a day (BID) | INTRAMUSCULAR | Status: DC

## 2014-04-23 MED ORDER — PHENYLEPHRINE HCL 10 MG/ML IJ SOLN
INTRAMUSCULAR | Status: DC | PRN
  Administered 2014-04-23 (×2): 40 ug via INTRAVENOUS

## 2014-04-23 MED ORDER — VECURONIUM BROMIDE 10 MG IV SOLR
INTRAVENOUS | Status: AC
  Filled 2014-04-23: qty 10

## 2014-04-23 SURGICAL SUPPLY — 67 items
BAG DECANTER FOR FLEXI CONT (MISCELLANEOUS) ×2 IMPLANT
BENZOIN TINCTURE PRP APPL 2/3 (GAUZE/BANDAGES/DRESSINGS) ×2 IMPLANT
BLADE CLIPPER SURG (BLADE) ×2 IMPLANT
BLADE SURG 11 STRL SS (BLADE) ×2 IMPLANT
BONE ALLOSTEM MORSELIZED 5CC (Bone Implant) ×2 IMPLANT
BUR MATCHSTICK NEURO 3.0 LAGG (BURR) ×2 IMPLANT
CANISTER SUCT 3000ML (MISCELLANEOUS) ×2 IMPLANT
CAP ELLIPSE LOCKING (Cap) ×10 IMPLANT
CONT SPEC 4OZ CLIKSEAL STRL BL (MISCELLANEOUS) ×2 IMPLANT
DECANTER SPIKE VIAL GLASS SM (MISCELLANEOUS) ×2 IMPLANT
DRAPE C-ARM 42X72 X-RAY (DRAPES) IMPLANT
DRAPE LAPAROTOMY 100X72 PEDS (DRAPES) ×2 IMPLANT
DRAPE MICROSCOPE LEICA (MISCELLANEOUS) IMPLANT
DRAPE POUCH INSTRU U-SHP 10X18 (DRAPES) ×2 IMPLANT
DRAPE SURG 17X23 STRL (DRAPES) ×10 IMPLANT
DRSG OPSITE 4X5.5 SM (GAUZE/BANDAGES/DRESSINGS) ×2 IMPLANT
DRSG OPSITE POSTOP 4X6 (GAUZE/BANDAGES/DRESSINGS) ×2 IMPLANT
DURAPREP 6ML APPLICATOR 50/CS (WOUND CARE) ×2 IMPLANT
ELECT REM PT RETURN 9FT ADLT (ELECTROSURGICAL) ×2
ELECTRODE REM PT RTRN 9FT ADLT (ELECTROSURGICAL) ×1 IMPLANT
GAUZE SPONGE 4X4 12PLY STRL (GAUZE/BANDAGES/DRESSINGS) ×2 IMPLANT
GAUZE SPONGE 4X4 16PLY XRAY LF (GAUZE/BANDAGES/DRESSINGS) IMPLANT
GLOVE BIO SURGEON STRL SZ8 (GLOVE) ×2 IMPLANT
GLOVE BIOGEL PI IND STRL 7.0 (GLOVE) ×3 IMPLANT
GLOVE BIOGEL PI INDICATOR 7.0 (GLOVE) ×3
GLOVE ECLIPSE 6.5 STRL STRAW (GLOVE) ×2 IMPLANT
GLOVE EXAM NITRILE LRG STRL (GLOVE) IMPLANT
GLOVE EXAM NITRILE MD LF STRL (GLOVE) IMPLANT
GLOVE EXAM NITRILE XL STR (GLOVE) IMPLANT
GLOVE EXAM NITRILE XS STR PU (GLOVE) IMPLANT
GLOVE INDICATOR 8.5 STRL (GLOVE) ×2 IMPLANT
GLOVE SURG SS PI 7.0 STRL IVOR (GLOVE) ×4 IMPLANT
GOWN STRL REUS W/ TWL LRG LVL3 (GOWN DISPOSABLE) ×1 IMPLANT
GOWN STRL REUS W/ TWL XL LVL3 (GOWN DISPOSABLE) ×2 IMPLANT
GOWN STRL REUS W/TWL 2XL LVL3 (GOWN DISPOSABLE) IMPLANT
GOWN STRL REUS W/TWL LRG LVL3 (GOWN DISPOSABLE) ×1
GOWN STRL REUS W/TWL XL LVL3 (GOWN DISPOSABLE) ×2
HEMOSTAT POWDER SURGIFOAM 1G (HEMOSTASIS) ×2 IMPLANT
KIT BASIN OR (CUSTOM PROCEDURE TRAY) ×2 IMPLANT
KIT INFUSE X SMALL 1.4CC (Orthopedic Implant) ×2 IMPLANT
KIT ROOM TURNOVER OR (KITS) ×2 IMPLANT
LIQUID BAND (GAUZE/BANDAGES/DRESSINGS) ×2 IMPLANT
MARKER SKIN DUAL TIP RULER LAB (MISCELLANEOUS) ×4 IMPLANT
NEEDLE HYPO 25X1 1.5 SAFETY (NEEDLE) ×2 IMPLANT
NEEDLE SPNL 20GX3.5 QUINCKE YW (NEEDLE) ×2 IMPLANT
NS IRRIG 1000ML POUR BTL (IV SOLUTION) ×2 IMPLANT
PACK LAMINECTOMY NEURO (CUSTOM PROCEDURE TRAY) ×2 IMPLANT
PAD ARMBOARD 7.5X6 YLW CONV (MISCELLANEOUS) ×6 IMPLANT
PIN MAYFIELD SKULL DISP (PIN) ×2 IMPLANT
ROD SPINE POST 3.5X80 (Rod) ×4 IMPLANT
RUBBERBAND STERILE (MISCELLANEOUS) IMPLANT
SCREW ELLIPSE 18X4.0 (Screw) ×4 IMPLANT
SPONGE GAUZE 4X4 12PLY STER LF (GAUZE/BANDAGES/DRESSINGS) ×2 IMPLANT
SPONGE LAP 4X18 X RAY DECT (DISPOSABLE) IMPLANT
SPONGE SURGIFOAM ABS GEL 100 (HEMOSTASIS) ×2 IMPLANT
STRIP CLOSURE SKIN 1/2X4 (GAUZE/BANDAGES/DRESSINGS) ×2 IMPLANT
STRIP CLOSURE SKIN 1/4X4 (GAUZE/BANDAGES/DRESSINGS) ×2 IMPLANT
SUT ETHILON 4 0 PS 2 18 (SUTURE) IMPLANT
SUT VIC AB 0 CT1 18XCR BRD8 (SUTURE) ×1 IMPLANT
SUT VIC AB 0 CT1 8-18 (SUTURE) ×1
SUT VIC AB 2-0 CT1 18 (SUTURE) ×2 IMPLANT
SUT VICRYL 4-0 PS2 18IN ABS (SUTURE) ×2 IMPLANT
SYR 20ML ECCENTRIC (SYRINGE) ×2 IMPLANT
TOWEL OR 17X24 6PK STRL BLUE (TOWEL DISPOSABLE) ×2 IMPLANT
TOWEL OR 17X26 10 PK STRL BLUE (TOWEL DISPOSABLE) ×2 IMPLANT
TRAY FOLEY CATH 14FRSI W/METER (CATHETERS) IMPLANT
WATER STERILE IRR 1000ML POUR (IV SOLUTION) ×2 IMPLANT

## 2014-04-23 NOTE — Op Note (Signed)
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 Placement of bilateral T1 pedicle screws and posterior cervical laminectomy and foraminotomies at C7-T1 for anatomical positioning for placement of T1 pedicle screws.  Surgeon: Dominica Severin Montgomery Favor  Asst.: Ashok Pall  Anesthesia: Gen.  EPL minimal  History of present illness Patient is a very pleasant 48 year old female had undergone previous posterior cervical fusion for pseudoarthrosis C5-C7 proximally 3 months ago patient did initially very well for the first 4-6 weeks to start expressing worsening neck pain felt pop in her neck and start expressing worsening radicular symptoms predominantly in the left C7 nerve root pattern. Workup revealed loosening of her C7 screws with the tips of the C7 screws displacing into the foramina. As well as a screw that had migrated in the facet joint of C6 on the right. So due to the malpositioning of the screws displacement pseudarthrosis and loosening of C7 screws I recommended re-expiration of fusion removal of the loose screws and malposition screw and replacement with T1 pedicle screws. I extensively over the risks and benefits of that operation with her as well as perioperative course expectations of outcome and alternatives of surgery and she understood and agreed to proceed 4. Patient is a long-standing history of lupus with chronic steroid and immunosuppressant use. I feel this has contributed to her difficulty obtaining solid fusions both anteriorly and posteriorly.  Operative procedure: Patient brought into the or was induced under general anesthesia positioned prone in pins back 7 neck shaved prepped and draped in routine sterile fashion her old incision was identified and infiltrated with 10 mL lidocaine with epi and opened  up and subperiosteal and dissection was carried out through the scar tissue identifying the spinous processes of C5-6-7 T1 the hardware was identified and exposed the hardware. I then removed the anchoring nuts and at C5-6 and 7 bilaterally the C6-7 level was noted to be pseudoarthrosis and had an abnormal amount of motion. I removed the loose C7 screws and removed the right C6 screw then I aggressively exposed the lateral masses and lamina and lateral mass of T1 start performing laminotomies 100 and posterior cervical laminotomy at C7-T1 extending out the foramen identified the C8 nerve root and the entirety both medial and superior borders of the T1 pedicle. And then using direct visualization of the T1 pedicle to pilot holes were drilled and then to 14 mm holes were drilled and then near cortex was tapped into 18 mm pedicle screws were placed in T1. I reexplored the borders of the pedicle and direct visualization to confirm no migration and fluoroscopy confirmed good position of the pedicle screws. Then at this point copious irrigated the wound aggressively reexposed the lateral masses extending from C5 down to T1 decorticated the lateral masses and lamina laid half of an extra small BMP kit more equivalent to an extra extra small as well as allostem morsels along the facet joints from C5 down to T1. Gelfoam was overlaid top of the dura the rods were then applied top tightening nuts were anchored down and then a drain was placed the wounds closed in layers with interrupted Vicryl running 4 subcuticular in the skin Dermabond benzo and Steri-Strips and sterile dressing patient recovered in stable condition. At the end the case on it counts sponge counts were correct.

## 2014-04-23 NOTE — Transfer of Care (Signed)
Immediate Anesthesia Transfer of Care Note  Patient: Dana EdisMichelle C Fizer  Procedure(s) Performed: Procedure(s): Posterior Cervical Laminectomy/Multi Level - C5-C6 - C6-C7 - C7-T1 removal hardware  (N/A)  Patient Location: PACU  Anesthesia Type:General  Level of Consciousness: awake, oriented and patient cooperative  Airway & Oxygen Therapy: Patient Spontanous Breathing and Patient connected to nasal cannula oxygen  Post-op Assessment: Report given to PACU RN and Post -op Vital signs reviewed and stable  Post vital signs: Reviewed  Complications: No apparent anesthesia complications

## 2014-04-23 NOTE — H&P (Signed)
Dana Bond is an 48 y.o. female.   Chief Complaint: Neck and left greater right arm pain HPI: Patient is a very pleasant 48 year old female is a progress worsening neck and pain into her left arm predominately rating down the first fingers of left ankle consistent with a C7 nerve root pattern. Workup has revealed loosening of her C7 screws and migration of the C7 screw into the partial aspect of the left C7 neural foramen. And due to the early loosening of the screws and the migration and the clinical C7 with a copy with neck pain I recommended repositioning replacement of the C7 screws from lateral masses in the C7 pedicle. I extensively gone over the risks and benefits of the operation with the patient as well as perioperative course expectations of outcome and alternatives of surgery and she understands and agrees to proceed forward.  Past Medical History  Diagnosis Date  . Lupus   . Hyperlipemia   . Family history of anesthesia complication     Mother N/V  . Anxiety   . Mental disorder   . GERD (gastroesophageal reflux disease)   . Hemorrhoid   . H/O hiatal hernia   . Fibromyalgia   . Arthritis   . DVT (deep venous thrombosis) 2012    post op  . Pleurisy   . Costochondritis   . Asthma     mild - no inhaler use in last 3 months  . Depression   . Headache(784.0)   . Migraines   . Anemia     takes iron  . Peripheral vascular disease     on trental  . Pneumonia     hx    Past Surgical History  Procedure Laterality Date  . Abdominal hysterectomy      Partial  . Cholecystectomy    . Tonsillectomy      and adneodis  . Appendectomy    . Cervical spine surgery      ACDF x 2  . Back surgery      lower disectomy  . Foot surgery Right     nerve removal  . Carpal tunnel release Right   . Lumbar laminectomy/decompression microdiscectomy Right 05/29/2013    Procedure: LUMBAR LAMINECTOMY/DECOMPRESSION MICRODISCECTOMY 1 LEVEL;  Surgeon: Mariam DollarGary Bond Aliha Diedrich, MD;  Location: [facility];  Service: Neurosurgery;  Laterality: Right;  LUMBAR LAMINECTOMY/DECOMPRESSION MICRODISCECTOMY 1 LEVEL  . Posterior cervical fusion/foraminotomy N/A 02/10/2014    Procedure: POSTERIOR CERVICAL FUSION/FORAMINOTOMY CERVICAL FIVE-SIX,CERVICAL SIX-,SEVEN;  Surgeon: Mariam DollarGary Bond Craige Patel, MD;  Location: [facility];  Service: Neurosurgery;  Laterality: N/A;  . Cesarean section  94    No family history on file. Social History:  reports that she quit smoking about 7 years ago. Her smoking use included Cigarettes. She has a 5 pack-year smoking history. She does not have any smokeless tobacco history on file. She reports that she does not drink alcohol or use illicit drugs.  Allergies:  Allergies  Allergen Reactions  . Augmentin [Amoxicillin-Pot Clavulanate] Anaphylaxis  . Ciprofloxacin Anaphylaxis  . Lyrica [Pregabalin] Anaphylaxis  . Adhesive [Tape] Rash    Clear tape  . Gabapentin Swelling  . Imuran [Azathioprine] Rash    Medications Prior to Admission  Medication Sig Dispense Refill  . ALPRAZolam (XANAX) 1 MG tablet Take 1 mg by mouth 2 (two) times daily as needed for anxiety.    Marland Kitchen. atorvastatin (LIPITOR) 20 MG tablet Take 20 mg by mouth daily.    . carisoprodol (SOMA) 350 MG tablet  Take 350 mg by mouth 3 (three) times daily as needed for muscle spasms.    . cholecalciferol (VITAMIN D) 1000 UNITS tablet Take 1,000 Units by mouth daily.    . colchicine 0.6 MG tablet Take 0.6 mg by mouth daily.    . DULoxetine (CYMBALTA) 60 MG capsule Take 60 mg by mouth 2 (two) times daily.    Marland Kitchen. esomeprazole (NEXIUM) 40 MG capsule Take 40 mg by mouth 2 (two) times daily before a meal.    . ferrous sulfate 325 (65 FE) MG tablet Take 325 mg by mouth daily with breakfast.    . folic acid (FOLVITE) 1 MG tablet Take 1 mg by mouth daily.    . hydrocortisone (ANUSOL-HC) 25 MG suppository Place 25 mg rectally 2 (two) times daily as needed for hemorrhoids.    . hydroxychloroquine (PLAQUENIL) 200 MG tablet Take 400 mg by  mouth daily.     . methotrexate (50 MG/ML) 1 G injection Inject 25 mg into the muscle once a week.     . mupirocin ointment (BACTROBAN) 2 % Place 1 application into the nose 2 (two) times daily. Last dose over week ago    . ondansetron (ZOFRAN) 8 MG tablet Take 8 mg by mouth every 8 (eight) hours as needed for nausea or vomiting.    . Oxycodone HCl (ROXICODONE) 10 MG TABS Take 1 tablet (10 mg total) by mouth every 4 (four) hours as needed for severe pain. 80 tablet 0  . pentoxifylline (TRENTAL) 400 MG CR tablet Take 400 mg by mouth 2 (two) times daily.    . predniSONE (DELTASONE) 5 MG tablet Take 5 mg by mouth daily with breakfast.    . traZODone (DESYREL) 100 MG tablet Take 100 mg by mouth at bedtime as needed for sleep.    Marland Kitchen. albuterol (PROVENTIL HFA;VENTOLIN HFA) 108 (90 BASE) MCG/ACT inhaler Inhale 1 puff into the lungs every 6 (six) hours as needed for wheezing or shortness of breath.    . diazepam (VALIUM) 10 MG tablet Take 1 tablet (10 mg total) by mouth every 6 (six) hours as needed for anxiety. (Patient not taking: Reported on 04/10/2014) 60 tablet 0  . HYDROmorphone (DILAUDID) 4 MG tablet Take 1 tablet (4 mg total) by mouth every 3 (three) hours as needed for severe pain. (Patient not taking: Reported on 04/10/2014) 60 tablet 0    No results found for this or any previous visit (from the past 48 hour(s)). No results found.  Review of Systems  Constitutional: Negative.   HENT: Negative.   Eyes: Negative.   Respiratory: Negative.   Cardiovascular: Negative.   Gastrointestinal: Negative.   Genitourinary: Negative.   Musculoskeletal: Negative.   Skin: Negative.   Neurological: Positive for tingling and tremors.  Endo/Heme/Allergies: Negative.   Psychiatric/Behavioral: Negative.     Blood pressure 142/56, pulse 85, temperature 98.5 F (36.9 C), temperature source Oral, resp. rate 20, weight 103.42 kg (228 lb), SpO2 99 %. Physical Exam  Constitutional: She is oriented to person,  place, and time. She appears well-developed and well-nourished.  HENT:  Head: Normocephalic.  Eyes: Pupils are equal, round, and reactive to light.  Neck: Normal range of motion.  Respiratory: Effort normal.  GI: Soft.  Neurological: She is alert and oriented to person, place, and time.  Strength is 5 out of 5 in her deltoid, bicep, tricep, wrist flexion, wrist extension, hand intrinsics.  Skin: Skin is warm and dry.     Assessment/Plan [age] year female presents  for posterior cervical revision of fusion with left and right C7 foraminotomy  Dana Bond 04/23/2014, 1:09 PM

## 2014-04-23 NOTE — Anesthesia Postprocedure Evaluation (Signed)
  Anesthesia Post-op Note  Patient: Dana EdisMichelle C Trani  Procedure(s) Performed: Procedure(s): Posterior Cervical Laminectomy/Multi Level - C5-C6 - C6-C7 - C7-T1 removal hardware  (N/A)  Patient Location: PACU  Anesthesia Type:General  Level of Consciousness: awake and alert   Airway and Oxygen Therapy: Patient Spontanous Breathing  Post-op Pain: mild  Post-op Assessment: Post-op Vital signs reviewed  Post-op Vital Signs: stable  Last Vitals:  Filed Vitals:   04/23/14 1700  BP: 134/68  Pulse: 89  Temp:   Resp: 13    Complications: No apparent anesthesia complications

## 2014-04-23 NOTE — Anesthesia Preprocedure Evaluation (Addendum)
Anesthesia Evaluation  Patient identified by MRN, date of birth, ID band Patient awake    Reviewed: Allergy & Precautions, H&P , NPO status , Patient's Chart, lab work & pertinent test results  History of Anesthesia Complications Negative for: history of anesthetic complications  Airway Mallampati: II   Neck ROM: Limited    Dental  (+) Teeth Intact, Caps,    Pulmonary asthma , former smoker,  breath sounds clear to auscultation        Cardiovascular + Peripheral Vascular Disease Rhythm:Regular Rate:Normal  Has Lupus   Neuro/Psych    GI/Hepatic hiatal hernia, GERD-  ,  Endo/Other    Renal/GU      Musculoskeletal  (+) Arthritis -, Fibromyalgia -  Abdominal (+) + obese,   Peds  Hematology   Anesthesia Other Findings   Reproductive/Obstetrics                            Anesthesia Physical Anesthesia Plan  ASA: II  Anesthesia Plan: General   Post-op Pain Management:    Induction: Intravenous  Airway Management Planned: Oral ETT and Video Laryngoscope Planned  Additional Equipment:   Intra-op Plan:   Post-operative Plan:   Informed Consent: I have reviewed the patients History and Physical, chart, labs and discussed the procedure including the risks, benefits and alternatives for the proposed anesthesia with the patient or authorized representative who has indicated his/her understanding and acceptance.   Dental advisory given  Plan Discussed with: CRNA and Surgeon  Anesthesia Plan Comments:         Anesthesia Quick Evaluation

## 2014-04-23 NOTE — Anesthesia Procedure Notes (Signed)
Procedure Name: Intubation Date/Time: 04/23/2014 1:45 PM Performed by: Quentin OreWALKER, Saralee Bolick E Pre-anesthesia Checklist: Patient identified, Emergency Drugs available, Suction available, Patient being monitored and Timeout performed Patient Re-evaluated:Patient Re-evaluated prior to inductionOxygen Delivery Method: Circle system utilized Preoxygenation: Pre-oxygenation with 100% oxygen Intubation Type: IV induction Ventilation: Mask ventilation without difficulty Tube type: Oral Tube size: 7.0 mm Number of attempts: 1 Airway Equipment and Method: Video-laryngoscopy and Stylet Placement Confirmation: ETT inserted through vocal cords under direct vision,  positive ETCO2 and breath sounds checked- equal and bilateral Secured at: 22 cm Tube secured with: Tape Dental Injury: Teeth and Oropharynx as per pre-operative assessment  Comments: Electively used glidescope d/t prior cervical sx. Neck remained in a neutral cervical alignment. Grade I view on glidescope screen, +ETCO2 & BBS =.

## 2014-04-23 NOTE — Progress Notes (Signed)
Orthopedic Tech Progress Note Patient Details:  Dana Bond 02/10/1966 [id number] Applied soft cervical collar. Ortho Devices Type of Ortho Device: Soft collar Ortho Device/Splint Interventions: Application   Lesle ChrisGilliland, Tavi Gaughran L 04/23/2014, 5:17 PM

## 2014-04-23 NOTE — Progress Notes (Signed)
Patient arrived from PACU. BEdside report with PACU RN, PCA administered. Safety precautions and orders reviewed with patient. Call light within reach. Will continue to monitor,   Sim BoastHavy, RN

## 2014-04-24 MED ORDER — OXYCODONE HCL 15 MG PO TABS: 15.0000 mg | ORAL_TABLET | ORAL | Status: DC | PRN

## 2014-04-24 NOTE — Progress Notes (Signed)
CARE MANAGEMENT NOTE 04/24/2014  Patient:  Dana Bond,Dana Bond   Account Number:  [id number]  Date Initiated:  04/24/2014  Documentation initiated by:  [facility],Kenasia Scheller  Subjective/Objective Assessment:   Posterior cervical revision of fusion     Action/Plan:   Anticipated DC Date:  04/24/2014   Anticipated DC Plan:  HOME/SELF CARE      DC Planning Services  CM consult      Choice offered to / List presented to:             Status of service:  Completed, signed off Medicare Important Message given?   (If response is "NO", the following Medicare IM given date fields will be blank) Date Medicare IM given:   Medicare IM given by:   Date Additional Medicare IM given:   Additional Medicare IM given by:    Discharge Disposition:  HOME/SELF CARE  Per UR Regulation:  Reviewed for med. necessity/level of care/duration of stay  If discussed at Long Length of Stay Meetings, dates discussed:    Comments:  04/24/2014 1000 NCM spoke to pt and states she has RW and cane at home. Her Mother is at home to assist with her care. No NCM needs identified. Isidoro DonningAlesia Rhen Kawecki RN CCM Case Mgmt phone [phone]

## 2014-04-24 NOTE — Discharge Instructions (Signed)
No lifting no bending no twisting no driving a riding a car unless she has come back and forth to see me. Keep incision clean dry and intact. °

## 2014-04-24 NOTE — Progress Notes (Signed)
   04/24/14 0844  Pressure Ulcer Prevention  Repositioned Sitting  Positioning Frequency Able to turn self  Hygiene  Hygiene Peri care  Level of Assistance Independent  Mobility  Activity Ambulate in hall;Ambulate in room;Bathroom privileges;Chair  Level of Assistance Standby assist, set-up cues, supervision of patient - no hands on  Assistive Device None  Ambulation Response Tolerated well  Bed Position Chair  Range of Motion Active;All extremities    Patient ambulated in hall with stand by assist. No distress noted. She denied pain while walking. She also voided at this time.  Sim BoastHavy, RN

## 2014-04-24 NOTE — Progress Notes (Addendum)
Discharge instruction reviewed with patient. RXs given. All questions answered at this time. No other distress noted. I offered to administered her AM medications but patient refused. She stated that she will take her meds at home once discharged.  Awaiting for transportation from family.  Sim BoastHavy, RN

## 2014-04-24 NOTE — Discharge Summary (Signed)
Physician Discharge Summary  Patient ID: Dana Bond MRN: [id number] DOB/AGE: 49/01/1966 49 y.o.  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 with loose C7 screws in C7 radiculopathy  Discharge Diagnoses: Same Active Problems:   Pseudoarthrosis of cervical spine   Discharged Condition: good  Hospital Course: Patient went to the hospital was taken to the operating room underwent posterior cervical revision fusion with pedicle screws placed at T1 postop patient did very well with recovered in the floor on the floor she was ambulating and voiding spontaneously tolerating her diet was stable for discharge home  Consults: Significant Diagnostic Studies: Treatments: Posterior cervical revision of fusion Discharge Exam: Blood pressure 124/66, pulse 93, temperature 98.1 F (36.7 C), temperature source Oral, resp. rate 16, height 5\' 8"  (1.727 m), weight 99.791 kg (220 lb), SpO2 97 %. Thank out of 5 wound clean dry and intact  Disposition: Home     Medication List    STOP taking these medications        diazepam 10 MG tablet  Commonly known as:  VALIUM      TAKE these medications        albuterol 108 (90 BASE) MCG/ACT inhaler  Commonly known as:  PROVENTIL HFA;VENTOLIN HFA  Inhale 1 puff into the lungs every 6 (six) hours as needed for wheezing or shortness of breath.     ALPRAZolam 1 MG tablet  Commonly known as:  XANAX  Take 1 mg by mouth 2 (two) times daily as needed for anxiety.     atorvastatin 20 MG tablet  Commonly known as:  LIPITOR  Take 20 mg by mouth daily.     carisoprodol 350 MG tablet  Commonly known as:  SOMA  Take 350 mg by mouth 3 (three) times daily as needed for muscle spasms.     cholecalciferol 1000 UNITS tablet  Commonly known as:  VITAMIN D  Take 1,000 Units by mouth daily.     colchicine 0.6 MG tablet  Take 0.6 mg by mouth daily.     DULoxetine 60 MG capsule  Commonly known as:  CYMBALTA   Take 60 mg by mouth 2 (two) times daily.     esomeprazole 40 MG capsule  Commonly known as:  NEXIUM  Take 40 mg by mouth 2 (two) times daily before a meal.     ferrous sulfate 325 (65 FE) MG tablet  Take 325 mg by mouth daily with breakfast.     folic acid 1 MG tablet  Commonly known as:  FOLVITE  Take 1 mg by mouth daily.     hydrocortisone 25 MG suppository  Commonly known as:  ANUSOL-HC  Place 25 mg rectally 2 (two) times daily as needed for hemorrhoids.     HYDROmorphone 4 MG tablet  Commonly known as:  DILAUDID  Take 1 tablet (4 mg total) by mouth every 3 (three) hours as needed for severe pain.     hydroxychloroquine 200 MG tablet  Commonly known as:  PLAQUENIL  Take 400 mg by mouth daily.     methotrexate 1 G injection  Commonly known as:  50 mg/ml  Inject 25 mg into the muscle once a week.     mupirocin ointment 2 %  Commonly known as:  BACTROBAN  Place 1 application into the nose 2 (two) times daily. Last dose over week ago     ondansetron 8 MG tablet  Commonly known as:  ZOFRAN  Take 8 mg by mouth every  8 (eight) hours as needed for nausea or vomiting.     Oxycodone HCl 10 MG Tabs  Commonly known as:  ROXICODONE  Take 1 tablet (10 mg total) by mouth every 4 (four) hours as needed for severe pain.     oxyCODONE 15 MG immediate release tablet  Commonly known as:  ROXICODONE  Take 1 tablet (15 mg total) by mouth every 4 (four) hours as needed for moderate pain or severe pain.     pentoxifylline 400 MG CR tablet  Commonly known as:  TRENTAL  Take 400 mg by mouth 2 (two) times daily.     predniSONE 5 MG tablet  Commonly known as:  DELTASONE  Take 5 mg by mouth daily with breakfast.     traZODone 100 MG tablet  Commonly known as:  DESYREL  Take 100 mg by mouth at bedtime as needed for sleep.           Follow-up Information    [facility]Delos Klich P, MD.   Specialty:  Neurosurgery   Contact information:   [address] [place] [address] [phone]       Signed: Felecia Stanfill P 04/24/2014, 7:08 AM

## 2014-04-24 NOTE — Progress Notes (Signed)
Patient ID: Dana Bond, female   DOB: 03/24/1966, 48 y.o.   MRN: [id number] Doing well significant improvement radicular symptoms neck pain improved on the pills.  Strength out of 5 wound clean dry and intact  Discharge home

## 2014-04-25 ENCOUNTER — Encounter (HOSPITAL_COMMUNITY): Payer: Self-pay | Admitting: Neurosurgery

## 2014-04-28 ENCOUNTER — Encounter (HOSPITAL_COMMUNITY): Payer: Self-pay | Admitting: Neurosurgery

## 2014-05-20 ENCOUNTER — Encounter (HOSPITAL_COMMUNITY): Payer: Self-pay | Admitting: Neurosurgery

## 2014-05-21 ENCOUNTER — Encounter (HOSPITAL_COMMUNITY): Payer: Self-pay | Admitting: Neurosurgery

## 2015-03-23 ENCOUNTER — Other Ambulatory Visit: Payer: Self-pay | Admitting: Neurosurgery

## 2015-03-23 DIAGNOSIS — M5412 Radiculopathy, cervical region: Secondary | ICD-10-CM

## 2015-05-07 ENCOUNTER — Ambulatory Visit
Admission: RE | Admit: 2015-05-07 | Discharge: 2015-05-07 | Disposition: A | Discharge status: Home / Self Care | Payer: Medicare Other | Source: Ambulatory Visit | Attending: Neurosurgery | Admitting: Neurosurgery

## 2015-05-07 DIAGNOSIS — M5412 Radiculopathy, cervical region: Secondary | ICD-10-CM

## 2015-09-07 ENCOUNTER — Other Ambulatory Visit: Payer: Self-pay | Admitting: Physician Assistant

## 2015-09-07 DIAGNOSIS — Z1231 Encounter for screening mammogram for malignant neoplasm of breast: Secondary | ICD-10-CM

## 2018-01-26 ENCOUNTER — Other Ambulatory Visit: Payer: Self-pay | Admitting: Neurosurgery

## 2018-01-31 NOTE — Pre-Procedure Instructions (Signed)
Dana Bond  01/31/2018      [facility]- [name], [place] - [address] [place] [address] Phone: [phone] Fax: [phone]    Your procedure is scheduled on  Monday  02/05/18  Report to [facility] Admitting at 530 A.M.  Call this number if you have problems the morning of surgery:  [phone]   Remember:  Do not eat or drink after midnight.     Take these medicines the morning of surgery with A SIP OF WATER     Do not wear jewelry, make-up or nail polish.  Do not wear lotions, powders, or perfumes, or deodorant.  Do not shave 48 hours prior to surgery.  Men may shave face and neck.  Do not bring valuables to the hospital.  [facility] is not responsible for any belongings or valuables.  Contacts, dentures or bridgework may not be worn into surgery.  Leave your suitcase in the car.  After surgery it may be brought to your room.  For patients admitted to the hospital, discharge time will be determined by your treatment team.  Patients discharged the day of surgery will not be allowed to drive home.   Name and phone number of your driver:    Special instructions:  [place] - Preparing for Surgery  Before surgery, you can play an important role.  Because skin is not sterile, your skin needs to be as free of germs as possible.  You can reduce the number of germs on you skin by washing with CHG (chlorahexidine gluconate) soap before surgery.  CHG is an antiseptic cleaner which kills germs and bonds with the skin to continue killing germs even after washing.  Oral Hygiene is also important in reducing the risk of infection.  Remember to brush your teeth with your regular toothpaste the morning of surgery.  Please DO NOT use if you have an allergy to CHG or antibacterial soaps.  If your skin becomes reddened/irritated stop using the CHG and inform your nurse when you arrive at Short Stay.  Do not shave  (including legs and underarms) for at least 48 hours prior to the first CHG shower.  You may shave your face.  Please follow these instructions carefully:   1.  Shower with CHG Soap the night before surgery and the morning of Surgery.  2.  If you choose to wash your hair, wash your hair first as usual with your normal shampoo.  3.  After you shampoo, rinse your hair and body thoroughly to remove the shampoo. 4.  Use CHG as you would any other liquid soap.  You can apply chg directly to the skin and wash gently with a      scrungie or washcloth.           5.  Apply the CHG Soap to your body ONLY FROM THE NECK DOWN.   Do not use on open wounds or open sores. Avoid contact with your eyes, ears, mouth and genitals (private parts).  Wash genitals (private parts) with your normal soap.  6.  Wash thoroughly, paying special attention to the area where your surgery will be performed.  7.  Thoroughly rinse your body with warm water from the neck down.  8.  DO NOT shower/wash with your normal soap after using and rinsing off the CHG Soap.  9.  Pat yourself dry with a clean towel.  10.  Wear clean pajamas.            11.  Place clean sheets on your bed the night of your first shower and do not sleep with pets.  Day of Surgery  Do not apply any lotions/deoderants the morning of surgery.   Please wear clean clothes to the hospital/surgery center. Remember to brush your teeth with toothpaste.     Please read over the following fact sheets that you were given. Pain Booklet, MRSA Information and Surgical Site Infection Prevention

## 2018-02-01 ENCOUNTER — Encounter (HOSPITAL_COMMUNITY): Payer: Self-pay

## 2018-02-01 ENCOUNTER — Encounter (HOSPITAL_COMMUNITY)
Admission: RE | Admit: 2018-02-01 | Discharge: 2018-02-01 | Disposition: A | Discharge status: Home / Self Care | Payer: Medicare HMO | Source: Ambulatory Visit | Attending: Neurosurgery | Admitting: Neurosurgery

## 2018-02-01 DIAGNOSIS — Z01812 Encounter for preprocedural laboratory examination: Secondary | ICD-10-CM | POA: Diagnosis not present

## 2018-02-01 DIAGNOSIS — Z01810 Encounter for preprocedural cardiovascular examination: Secondary | ICD-10-CM | POA: Insufficient documentation

## 2018-02-01 LAB — CBC
HCT: 40.3 % (ref 36.0–46.0)
Hemoglobin: 12.5 g/dL (ref 12.0–15.0)
MCH: 27.8 pg (ref 26.0–34.0)
MCHC: 31 g/dL (ref 30.0–36.0)
MCV: 89.8 fL (ref 78.0–100.0)
Platelets: 325 10*3/uL (ref 150–400)
RBC: 4.49 MIL/uL (ref 3.87–5.11)
RDW: 14.1 % (ref 11.5–15.5)
WBC: 10.4 10*3/uL (ref 4.0–10.5)

## 2018-02-01 LAB — BASIC METABOLIC PANEL
Anion gap: 9 (ref 5–15)
BUN: 8 mg/dL (ref 6–20)
CO2: 25 mmol/L (ref 22–32)
Calcium: 9.5 mg/dL (ref 8.9–10.3)
Chloride: 106 mmol/L (ref 98–111)
Creatinine, Ser: 0.81 mg/dL (ref 0.44–1.00)
GFR calc Af Amer: >60 mL/min (ref >60)
GFR calc non Af Amer: >60 mL/min (ref >60)
GLUCOSE: 123 mg/dL — AB (ref 70–99)
POTASSIUM: 4.3 mmol/L (ref 3.5–5.1)
Sodium: 140 mmol/L (ref 135–145)

## 2018-02-01 LAB — SURGICAL PCR SCREEN
MRSA, PCR: NEGATIVE
Staphylococcus aureus: NEGATIVE

## 2018-02-01 MED ORDER — CHLORHEXIDINE GLUCONATE CLOTH 2 % EX PADS: 6.0000 | MEDICATED_PAD | Freq: Once | CUTANEOUS | Status: DC

## 2018-02-02 MED ORDER — VANCOMYCIN HCL 10 G IV SOLR
1500.0000 mg | INTRAVENOUS | Status: AC
  Administered 2018-02-05: 1500 mg via INTRAVENOUS
  Filled 2018-02-02: qty 1500

## 2018-02-05 ENCOUNTER — Ambulatory Visit (HOSPITAL_COMMUNITY)
Admission: RE | Admit: 2018-02-05 | Discharge: 2018-02-05 | Disposition: A | Discharge status: Home / Self Care | Payer: Medicare HMO | Source: Ambulatory Visit | Attending: Neurosurgery | Admitting: Neurosurgery

## 2018-02-05 ENCOUNTER — Ambulatory Visit (HOSPITAL_COMMUNITY): Payer: Medicare HMO

## 2018-02-05 ENCOUNTER — Encounter (HOSPITAL_COMMUNITY): Payer: Self-pay | Admitting: Certified Registered Nurse Anesthetist

## 2018-02-05 ENCOUNTER — Encounter (HOSPITAL_COMMUNITY)
Admission: RE | Disposition: A | Discharge status: Home / Self Care | Payer: Self-pay | Source: Ambulatory Visit | Attending: Neurosurgery

## 2018-02-05 ENCOUNTER — Other Ambulatory Visit: Payer: Self-pay

## 2018-02-05 DIAGNOSIS — Z79899 Other long term (current) drug therapy: Secondary | ICD-10-CM | POA: Insufficient documentation

## 2018-02-05 DIAGNOSIS — M5117 Intervertebral disc disorders with radiculopathy, lumbosacral region: Secondary | ICD-10-CM | POA: Insufficient documentation

## 2018-02-05 DIAGNOSIS — Z86718 Personal history of other venous thrombosis and embolism: Secondary | ICD-10-CM | POA: Insufficient documentation

## 2018-02-05 DIAGNOSIS — M1990 Unspecified osteoarthritis, unspecified site: Secondary | ICD-10-CM | POA: Insufficient documentation

## 2018-02-05 DIAGNOSIS — E785 Hyperlipidemia, unspecified: Secondary | ICD-10-CM | POA: Insufficient documentation

## 2018-02-05 DIAGNOSIS — I739 Peripheral vascular disease, unspecified: Secondary | ICD-10-CM | POA: Insufficient documentation

## 2018-02-05 DIAGNOSIS — M5126 Other intervertebral disc displacement, lumbar region: Secondary | ICD-10-CM | POA: Diagnosis present

## 2018-02-05 DIAGNOSIS — Z888 Allergy status to other drugs, medicaments and biological substances status: Secondary | ICD-10-CM | POA: Diagnosis not present

## 2018-02-05 DIAGNOSIS — Z87891 Personal history of nicotine dependence: Secondary | ICD-10-CM | POA: Insufficient documentation

## 2018-02-05 DIAGNOSIS — M329 Systemic lupus erythematosus, unspecified: Secondary | ICD-10-CM | POA: Diagnosis not present

## 2018-02-05 DIAGNOSIS — Z880 Allergy status to penicillin: Secondary | ICD-10-CM | POA: Insufficient documentation

## 2018-02-05 DIAGNOSIS — J45909 Unspecified asthma, uncomplicated: Secondary | ICD-10-CM | POA: Diagnosis not present

## 2018-02-05 DIAGNOSIS — M797 Fibromyalgia: Secondary | ICD-10-CM | POA: Diagnosis not present

## 2018-02-05 DIAGNOSIS — Z419 Encounter for procedure for purposes other than remedying health state, unspecified: Secondary | ICD-10-CM

## 2018-02-05 HISTORY — PX: LUMBAR LAMINECTOMY/DECOMPRESSION MICRODISCECTOMY: SHX5026

## 2018-02-05 SURGERY — LUMBAR LAMINECTOMY/DECOMPRESSION MICRODISCECTOMY 1 LEVEL
Anesthesia: General | Site: Back | Laterality: Left

## 2018-02-05 MED ORDER — SODIUM CHLORIDE 0.9 % IV SOLN
INTRAVENOUS | Status: DC | PRN
  Administered 2018-02-05: 08:00:00

## 2018-02-05 MED ORDER — DEXAMETHASONE SODIUM PHOSPHATE 10 MG/ML IJ SOLN
10.0000 mg | INTRAMUSCULAR | Status: AC
  Administered 2018-02-05: 10 mg via INTRAVENOUS
  Filled 2018-02-05: qty 1

## 2018-02-05 MED ORDER — ONDANSETRON HCL 4 MG PO TABS: 8.0000 mg | ORAL_TABLET | Freq: Three times a day (TID) | ORAL | Status: DC | PRN

## 2018-02-05 MED ORDER — LIDOCAINE-EPINEPHRINE 1 %-1:100000 IJ SOLN
INTRAMUSCULAR | Status: AC
  Filled 2018-02-05: qty 1

## 2018-02-05 MED ORDER — OXYCODONE HCL 5 MG PO TABS
10.0000 mg | ORAL_TABLET | ORAL | Status: DC | PRN
  Administered 2018-02-05 (×3): 10 mg via ORAL
  Filled 2018-02-05 (×2): qty 2

## 2018-02-05 MED ORDER — THROMBIN 5000 UNITS EX SOLR
CUTANEOUS | Status: AC
  Filled 2018-02-05: qty 15000

## 2018-02-05 MED ORDER — MIDAZOLAM HCL 2 MG/2ML IJ SOLN
INTRAMUSCULAR | Status: AC
  Filled 2018-02-05: qty 2

## 2018-02-05 MED ORDER — CYCLOBENZAPRINE HCL 10 MG PO TABS
10.0000 mg | ORAL_TABLET | Freq: Three times a day (TID) | ORAL | Status: DC | PRN
  Administered 2018-02-05 (×2): 10 mg via ORAL
  Filled 2018-02-05: qty 1

## 2018-02-05 MED ORDER — DULOXETINE HCL 30 MG PO CPEP
60.0000 mg | ORAL_CAPSULE | Freq: Two times a day (BID) | ORAL | Status: DC
  Administered 2018-02-05: 60 mg via ORAL
  Filled 2018-02-05: qty 2

## 2018-02-05 MED ORDER — PROPOFOL 10 MG/ML IV BOLUS
INTRAVENOUS | Status: DC | PRN
  Administered 2018-02-05: 160 mg via INTRAVENOUS

## 2018-02-05 MED ORDER — HYDROMORPHONE HCL 1 MG/ML IJ SOLN
0.2500 mg | INTRAMUSCULAR | Status: DC | PRN
  Administered 2018-02-05: 0.5 mg via INTRAVENOUS

## 2018-02-05 MED ORDER — ONDANSETRON HCL 4 MG/2ML IJ SOLN: 4.0000 mg | Freq: Four times a day (QID) | INTRAMUSCULAR | Status: DC | PRN

## 2018-02-05 MED ORDER — CYCLOBENZAPRINE HCL 10 MG PO TABS
ORAL_TABLET | ORAL | Status: AC
  Filled 2018-02-05: qty 1

## 2018-02-05 MED ORDER — ONDANSETRON HCL 4 MG PO TABS: 4.0000 mg | ORAL_TABLET | Freq: Four times a day (QID) | ORAL | Status: DC | PRN

## 2018-02-05 MED ORDER — 0.9 % SODIUM CHLORIDE (POUR BTL) OPTIME
TOPICAL | Status: DC | PRN
  Administered 2018-02-05: 1000 mL

## 2018-02-05 MED ORDER — CLONAZEPAM 0.5 MG PO TABS
0.5000 mg | ORAL_TABLET | Freq: Three times a day (TID) | ORAL | Status: DC
  Administered 2018-02-05: 0.5 mg via ORAL
  Filled 2018-02-05 (×2): qty 1

## 2018-02-05 MED ORDER — ACETAMINOPHEN 325 MG PO TABS: 650.0000 mg | ORAL_TABLET | ORAL | Status: DC | PRN

## 2018-02-05 MED ORDER — BUPIVACAINE HCL (PF) 0.25 % IJ SOLN
INTRAMUSCULAR | Status: DC | PRN
  Administered 2018-02-05: 10 mL

## 2018-02-05 MED ORDER — PHENOL 1.4 % MT LIQD: 1.0000 | OROMUCOSAL | Status: DC | PRN

## 2018-02-05 MED ORDER — METOPROLOL SUCCINATE ER 25 MG PO TB24: 25.0000 mg | ORAL_TABLET | Freq: Every day | ORAL | Status: DC

## 2018-02-05 MED ORDER — MENTHOL 3 MG MT LOZG: 1.0000 | LOZENGE | OROMUCOSAL | Status: DC | PRN

## 2018-02-05 MED ORDER — SODIUM CHLORIDE 0.9% FLUSH: 3.0000 mL | INTRAVENOUS | Status: DC | PRN

## 2018-02-05 MED ORDER — COLCHICINE 0.6 MG PO TABS: 0.6000 mg | ORAL_TABLET | Freq: Every day | ORAL | Status: DC

## 2018-02-05 MED ORDER — LUBIPROSTONE 24 MCG PO CAPS
24.0000 ug | ORAL_CAPSULE | Freq: Two times a day (BID) | ORAL | Status: DC
  Administered 2018-02-05: 24 ug via ORAL
  Filled 2018-02-05 (×2): qty 1

## 2018-02-05 MED ORDER — LIDOCAINE 2% (20 MG/ML) 5 ML SYRINGE
INTRAMUSCULAR | Status: DC | PRN
  Administered 2018-02-05: 100 mg via INTRAVENOUS

## 2018-02-05 MED ORDER — PROPOFOL 10 MG/ML IV BOLUS
INTRAVENOUS | Status: AC
  Filled 2018-02-05: qty 40

## 2018-02-05 MED ORDER — THROMBIN 5000 UNITS EX SOLR
CUTANEOUS | Status: DC | PRN
  Administered 2018-02-05: 10000 [IU] via TOPICAL

## 2018-02-05 MED ORDER — HYDROMORPHONE HCL 1 MG/ML IJ SOLN: 0.5000 mg | INTRAMUSCULAR | Status: DC | PRN

## 2018-02-05 MED ORDER — PANTOPRAZOLE SODIUM 40 MG PO TBEC: 40.0000 mg | DELAYED_RELEASE_TABLET | Freq: Every day | ORAL | Status: DC

## 2018-02-05 MED ORDER — VITAMIN D 1000 UNITS PO TABS
1000.0000 [IU] | ORAL_TABLET | Freq: Every day | ORAL | Status: DC
  Administered 2018-02-05: 1000 [IU] via ORAL
  Filled 2018-02-05: qty 1

## 2018-02-05 MED ORDER — HYDROMORPHONE HCL 1 MG/ML IJ SOLN
INTRAMUSCULAR | Status: AC
  Filled 2018-02-05: qty 1

## 2018-02-05 MED ORDER — PREDNISONE 10 MG PO TABS
10.0000 mg | ORAL_TABLET | Freq: Every day | ORAL | Status: DC
  Filled 2018-02-05: qty 1

## 2018-02-05 MED ORDER — LACTATED RINGERS IV SOLN
INTRAVENOUS | Status: DC | PRN
  Administered 2018-02-05: 07:00:00 via INTRAVENOUS

## 2018-02-05 MED ORDER — BUPIVACAINE HCL (PF) 0.25 % IJ SOLN
INTRAMUSCULAR | Status: AC
  Filled 2018-02-05: qty 30

## 2018-02-05 MED ORDER — ALUM & MAG HYDROXIDE-SIMETH 200-200-20 MG/5ML PO SUSP: 30.0000 mL | Freq: Four times a day (QID) | ORAL | Status: DC | PRN

## 2018-02-05 MED ORDER — PHENYLEPHRINE 40 MCG/ML (10ML) SYRINGE FOR IV PUSH (FOR BLOOD PRESSURE SUPPORT)
PREFILLED_SYRINGE | INTRAVENOUS | Status: AC
  Filled 2018-02-05: qty 10

## 2018-02-05 MED ORDER — OXYCODONE HCL 5 MG PO TABS
ORAL_TABLET | ORAL | Status: AC
  Filled 2018-02-05: qty 2

## 2018-02-05 MED ORDER — VANCOMYCIN HCL IN DEXTROSE 1-5 GM/200ML-% IV SOLN
1000.0000 mg | Freq: Two times a day (BID) | INTRAVENOUS | Status: DC
  Administered 2018-02-05: 1000 mg via INTRAVENOUS
  Filled 2018-02-05: qty 200

## 2018-02-05 MED ORDER — ONDANSETRON HCL 4 MG/2ML IJ SOLN
INTRAMUSCULAR | Status: DC | PRN
  Administered 2018-02-05: 4 mg via INTRAVENOUS

## 2018-02-05 MED ORDER — HEMOSTATIC AGENTS (NO CHARGE) OPTIME
TOPICAL | Status: DC | PRN
  Administered 2018-02-05: 1 via TOPICAL

## 2018-02-05 MED ORDER — CARISOPRODOL 350 MG PO TABS: 350.0000 mg | ORAL_TABLET | Freq: Three times a day (TID) | ORAL | Status: DC | PRN

## 2018-02-05 MED ORDER — QUETIAPINE FUMARATE 50 MG PO TABS
50.0000 mg | ORAL_TABLET | Freq: Two times a day (BID) | ORAL | Status: DC
  Administered 2018-02-05: 50 mg via ORAL
  Filled 2018-02-05 (×2): qty 1

## 2018-02-05 MED ORDER — PENTOXIFYLLINE ER 400 MG PO TBCR
400.0000 mg | EXTENDED_RELEASE_TABLET | Freq: Two times a day (BID) | ORAL | Status: DC
  Administered 2018-02-05: 400 mg via ORAL
  Filled 2018-02-05 (×2): qty 1

## 2018-02-05 MED ORDER — FENTANYL CITRATE (PF) 250 MCG/5ML IJ SOLN
INTRAMUSCULAR | Status: AC
  Filled 2018-02-05: qty 5

## 2018-02-05 MED ORDER — FENTANYL CITRATE (PF) 250 MCG/5ML IJ SOLN
INTRAMUSCULAR | Status: DC | PRN
  Administered 2018-02-05 (×3): 50 ug via INTRAVENOUS
  Administered 2018-02-05: 100 ug via INTRAVENOUS

## 2018-02-05 MED ORDER — SUCCINYLCHOLINE 20MG/ML (10ML) SYRINGE FOR MEDFUSION PUMP - OPTIME
INTRAMUSCULAR | Status: DC | PRN
  Administered 2018-02-05: 120 mg via INTRAVENOUS

## 2018-02-05 MED ORDER — CEFAZOLIN SODIUM-DEXTROSE 2-4 GM/100ML-% IV SOLN: 2.0000 g | Freq: Three times a day (TID) | INTRAVENOUS | Status: DC

## 2018-02-05 MED ORDER — PANTOPRAZOLE SODIUM 40 MG PO TBEC: 80.0000 mg | DELAYED_RELEASE_TABLET | Freq: Every day | ORAL | Status: DC

## 2018-02-05 MED ORDER — FOLIC ACID 1 MG PO TABS
1.0000 mg | ORAL_TABLET | Freq: Every day | ORAL | Status: DC
  Administered 2018-02-05: 1 mg via ORAL
  Filled 2018-02-05: qty 1

## 2018-02-05 MED ORDER — SUGAMMADEX SODIUM 200 MG/2ML IV SOLN
INTRAVENOUS | Status: DC | PRN
  Administered 2018-02-05: 300 mg via INTRAVENOUS

## 2018-02-05 MED ORDER — EPHEDRINE 5 MG/ML INJ
INTRAVENOUS | Status: AC
  Filled 2018-02-05: qty 20

## 2018-02-05 MED ORDER — HYDROCORTISONE ACETATE 25 MG RE SUPP
25.0000 mg | Freq: Two times a day (BID) | RECTAL | Status: DC | PRN
  Filled 2018-02-05: qty 1

## 2018-02-05 MED ORDER — ATORVASTATIN CALCIUM 20 MG PO TABS
20.0000 mg | ORAL_TABLET | Freq: Every day | ORAL | Status: DC
  Administered 2018-02-05: 20 mg via ORAL
  Filled 2018-02-05: qty 1

## 2018-02-05 MED ORDER — SODIUM CHLORIDE 0.9 % IV SOLN: 250.0000 mL | INTRAVENOUS | Status: DC

## 2018-02-05 MED ORDER — MIDAZOLAM HCL 2 MG/2ML IJ SOLN
INTRAMUSCULAR | Status: DC | PRN
  Administered 2018-02-05: 2 mg via INTRAVENOUS

## 2018-02-05 MED ORDER — LIDOCAINE-EPINEPHRINE 1 %-1:100000 IJ SOLN
INTRAMUSCULAR | Status: DC | PRN
  Administered 2018-02-05: 10 mL

## 2018-02-05 MED ORDER — HYDROXYCHLOROQUINE SULFATE 200 MG PO TABS
400.0000 mg | ORAL_TABLET | Freq: Every day | ORAL | Status: DC
  Administered 2018-02-05: 400 mg via ORAL
  Filled 2018-02-05: qty 2

## 2018-02-05 MED ORDER — ROCURONIUM BROMIDE 10 MG/ML (PF) SYRINGE
PREFILLED_SYRINGE | INTRAVENOUS | Status: DC | PRN
  Administered 2018-02-05: 50 mg via INTRAVENOUS

## 2018-02-05 MED ORDER — SODIUM CHLORIDE 0.9% FLUSH: 3.0000 mL | Freq: Two times a day (BID) | INTRAVENOUS | Status: DC

## 2018-02-05 MED ORDER — ACETAMINOPHEN 650 MG RE SUPP: 650.0000 mg | RECTAL | Status: DC | PRN

## 2018-02-05 SURGICAL SUPPLY — 54 items
BAG DECANTER FOR FLEXI CONT (MISCELLANEOUS) ×2 IMPLANT
BENZOIN TINCTURE PRP APPL 2/3 (GAUZE/BANDAGES/DRESSINGS) ×2 IMPLANT
BLADE CLIPPER SURG (BLADE) IMPLANT
BLADE SURG 11 STRL SS (BLADE) ×2 IMPLANT
BUR CUTTER 7.0 ROUND (BURR) ×2 IMPLANT
BUR MATCHSTICK NEURO 3.0 LAGG (BURR) ×2 IMPLANT
CANISTER SUCT 3000ML PPV (MISCELLANEOUS) ×2 IMPLANT
CARTRIDGE OIL MAESTRO DRILL (MISCELLANEOUS) ×1 IMPLANT
DECANTER SPIKE VIAL GLASS SM (MISCELLANEOUS) ×2 IMPLANT
DERMABOND ADVANCED (GAUZE/BANDAGES/DRESSINGS) ×1
DERMABOND ADVANCED .7 DNX12 (GAUZE/BANDAGES/DRESSINGS) ×1 IMPLANT
DIFFUSER DRILL AIR PNEUMATIC (MISCELLANEOUS) ×2 IMPLANT
DRAPE HALF SHEET 40X57 (DRAPES) IMPLANT
DRAPE LAPAROTOMY 100X72X124 (DRAPES) ×2 IMPLANT
DRAPE MICROSCOPE LEICA (MISCELLANEOUS) ×2 IMPLANT
DRAPE SURG 17X23 STRL (DRAPES) ×2 IMPLANT
DRSG OPSITE POSTOP 4X6 (GAUZE/BANDAGES/DRESSINGS) ×2 IMPLANT
DURAPREP 26ML APPLICATOR (WOUND CARE) ×2 IMPLANT
ELECT BLADE 4.0 EZ CLEAN MEGAD (MISCELLANEOUS) ×2
ELECT REM PT RETURN 9FT ADLT (ELECTROSURGICAL) ×2
ELECTRODE BLDE 4.0 EZ CLN MEGD (MISCELLANEOUS) ×1 IMPLANT
ELECTRODE REM PT RTRN 9FT ADLT (ELECTROSURGICAL) ×1 IMPLANT
GAUZE 4X4 16PLY RFD (DISPOSABLE) IMPLANT
GAUZE SPONGE 4X4 12PLY STRL (GAUZE/BANDAGES/DRESSINGS) ×2 IMPLANT
GLOVE BIO SURGEON STRL SZ7 (GLOVE) IMPLANT
GLOVE BIO SURGEON STRL SZ8 (GLOVE) ×2 IMPLANT
GLOVE BIOGEL PI IND STRL 7.0 (GLOVE) IMPLANT
GLOVE BIOGEL PI INDICATOR 7.0 (GLOVE)
GLOVE EXAM NITRILE LRG STRL (GLOVE) IMPLANT
GLOVE EXAM NITRILE XL STR (GLOVE) IMPLANT
GLOVE EXAM NITRILE XS STR PU (GLOVE) IMPLANT
GLOVE INDICATOR 8.5 STRL (GLOVE) ×2 IMPLANT
GOWN STRL REUS W/ TWL LRG LVL3 (GOWN DISPOSABLE) ×1 IMPLANT
GOWN STRL REUS W/ TWL XL LVL3 (GOWN DISPOSABLE) ×2 IMPLANT
GOWN STRL REUS W/TWL 2XL LVL3 (GOWN DISPOSABLE) IMPLANT
GOWN STRL REUS W/TWL LRG LVL3 (GOWN DISPOSABLE) ×1
GOWN STRL REUS W/TWL XL LVL3 (GOWN DISPOSABLE) ×2
KIT BASIN OR (CUSTOM PROCEDURE TRAY) ×2 IMPLANT
KIT TURNOVER KIT B (KITS) ×2 IMPLANT
NEEDLE HYPO 22GX1.5 SAFETY (NEEDLE) ×2 IMPLANT
NEEDLE SPNL 22GX3.5 QUINCKE BK (NEEDLE) ×2 IMPLANT
NS IRRIG 1000ML POUR BTL (IV SOLUTION) ×2 IMPLANT
OIL CARTRIDGE MAESTRO DRILL (MISCELLANEOUS) ×2
PACK LAMINECTOMY NEURO (CUSTOM PROCEDURE TRAY) ×2 IMPLANT
RUBBERBAND STERILE (MISCELLANEOUS) ×4 IMPLANT
SPONGE SURGIFOAM ABS GEL SZ50 (HEMOSTASIS) ×2 IMPLANT
STRIP CLOSURE SKIN 1/2X4 (GAUZE/BANDAGES/DRESSINGS) ×2 IMPLANT
SUT VIC AB 0 CT1 18XCR BRD8 (SUTURE) ×1 IMPLANT
SUT VIC AB 0 CT1 8-18 (SUTURE) ×1
SUT VIC AB 2-0 CT1 18 (SUTURE) ×2 IMPLANT
SUT VICRYL 4-0 PS2 18IN ABS (SUTURE) ×2 IMPLANT
TOWEL GREEN STERILE (TOWEL DISPOSABLE) ×2 IMPLANT
TOWEL GREEN STERILE FF (TOWEL DISPOSABLE) ×2 IMPLANT
WATER STERILE IRR 1000ML POUR (IV SOLUTION) ×2 IMPLANT

## 2018-02-05 NOTE — Anesthesia Procedure Notes (Signed)
Procedure Name: Intubation Date/Time: 02/05/2018 7:42 AM Performed by: Samara DeistBeckner, Markies Mowatt B, CRNA Pre-anesthesia Checklist: Patient identified, Emergency Drugs available, Suction available and Patient being monitored Patient Re-evaluated:Patient Re-evaluated prior to induction Oxygen Delivery Method: Circle system utilized Preoxygenation: Pre-oxygenation with 100% oxygen Induction Type: IV induction Ventilation: Oral airway inserted - appropriate to patient size and Two handed mask ventilation required Laryngoscope Size: Glidescope and 3 Grade View: Grade I Tube type: Oral Tube size: 7.0 mm Number of attempts: 1 Airway Equipment and Method: Video-laryngoscopy Placement Confirmation: ETT inserted through vocal cords under direct vision,  positive ETCO2,  CO2 detector and breath sounds checked- equal and bilateral Secured at: 22 cm Tube secured with: Tape Dental Injury: Teeth and Oropharynx as per pre-operative assessment  Comments: Performed by Drue StagerSRNA Mandy Engel

## 2018-02-05 NOTE — Anesthesia Postprocedure Evaluation (Signed)
Anesthesia Post Note  Patient: Dana Bond  Procedure(s) Performed: Microdiscectomy - Lumbar Five-Sacral One - left (Left Back)     Patient location during evaluation: PACU Anesthesia Type: General Level of consciousness: awake Pain management: pain level controlled Vital Signs Assessment: post-procedure vital signs reviewed and stable Respiratory status: spontaneous breathing Cardiovascular status: stable Postop Assessment: no apparent nausea or vomiting Anesthetic complications: no    Last Vitals:  Vitals:   02/05/18 1003 02/05/18 1036  BP: (!) 140/97 131/79  Pulse: 82 92  Resp: (!) 7 18  Temp:  36.6 C  SpO2: 99% 96%    Last Pain:  Vitals:   02/05/18 1036  TempSrc: Oral  PainSc:                  Jonita Hirota

## 2018-02-05 NOTE — Anesthesia Preprocedure Evaluation (Signed)
Anesthesia Evaluation  Patient identified by MRN, date of birth, ID band Patient awake    Reviewed: Allergy & Precautions, NPO status , Patient's Chart, lab work & pertinent test results  Airway Mallampati: II  TM Distance: >3 FB     Dental   Pulmonary asthma , pneumonia, former smoker,    breath sounds clear to auscultation       Cardiovascular + Peripheral Vascular Disease   Rhythm:Regular Rate:Normal     Neuro/Psych  Headaches,  Neuromuscular disease    GI/Hepatic Neg liver ROS, hiatal hernia, GERD  ,  Endo/Other    Renal/GU negative Renal ROS     Musculoskeletal  (+) Arthritis , Fibromyalgia -  Abdominal   Peds  Hematology  (+) anemia ,   Anesthesia Other Findings   Reproductive/Obstetrics                             Anesthesia Physical Anesthesia Plan  ASA: III  Anesthesia Plan: General   Post-op Pain Management:    Induction: Intravenous  PONV Risk Score and Plan: Midazolam, Treatment may vary due to age or medical condition and Ondansetron  Airway Management Planned: Oral ETT  Additional Equipment:   Intra-op Plan:   Post-operative Plan: Possible Post-op intubation/ventilation  Informed Consent: I have reviewed the patients History and Physical, chart, labs and discussed the procedure including the risks, benefits and alternatives for the proposed anesthesia with the patient or authorized representative who has indicated his/her understanding and acceptance.   Dental advisory given  Plan Discussed with: CRNA and Anesthesiologist  Anesthesia Plan Comments:         Anesthesia Quick Evaluation

## 2018-02-05 NOTE — Transfer of Care (Signed)
Immediate Anesthesia Transfer of Care Note  Patient: Dana FishmanLeigh M Bond  Procedure(s) Performed: Microdiscectomy - Lumbar Five-Sacral One - left (Left Back)  Patient Location: PACU  Anesthesia Type:General  Level of Consciousness: awake, alert , oriented and patient cooperative  Airway & Oxygen Therapy: Patient Spontanous Breathing  Post-op Assessment: Report given to RN, Post -op Vital signs reviewed and stable and Patient moving all extremities X 4  Post vital signs: Reviewed and stable  Last Vitals:  Vitals Value Taken Time  BP 127/80 02/05/2018  9:19 AM  Temp    Pulse 93 02/05/2018  9:24 AM  Resp 12 02/05/2018  9:24 AM  SpO2 90 % 02/05/2018  9:24 AM  Vitals shown include unvalidated device data.  Last Pain:  Vitals:   02/05/18 0648  TempSrc:   PainSc: 6          Complications: No apparent anesthesia complications

## 2018-02-05 NOTE — Discharge Summary (Signed)
Physician Discharge Summary  Patient ID: Dana FishmanLeigh M Bond MRN: [id number] DOB/AGE: 53/01/1966 53 y.o. Estimated body mass index is 39.53 kg/m as calculated from the following:   Height as of this encounter: 5\' 8"  (1.727 m).   Weight as of this encounter: 117.9 kg.   Admit date: 02/05/2018 Discharge date: 02/05/2018  Admission Diagnoses: Left S1 radiculopathy  Discharge Diagnoses: Same Active Problems:   HNP (herniated nucleus pulposus), lumbar   Discharged Condition: good  Hospital Course: Patient was admitted to the hospital underwent a left-sided 5 1 microdiscectomy.  Postoperatively patient did very well and covering the floor on the floor patient was ambulating and voiding spontaneously tolerating regular diet stable for discharge home.  Consults: Significant Diagnostic Studies: Treatments: Left L5-S1 microdiscectomy Discharge Exam: Blood pressure 131/70, pulse (!) 103, temperature 97.9 F (36.6 C), temperature source Oral, resp. rate 18, height 5\' 8"  (1.727 m), weight 117.9 kg, SpO2 96 %. Strength 5 out of 5 wound clean dry and intact  Disposition: Home   Allergies as of 02/05/2018      Reactions   Alendronate Anaphylaxis   Augmentin [amoxicillin-pot Clavulanate] Anaphylaxis   Ciprofloxacin Anaphylaxis   Pregabalin Anaphylaxis   Throat swelling    Adhesive [tape] Rash   Clear tape   Alendronate Sodium Other (See Comments), Swelling   Gabapentin Swelling   Throat swelling   Other Rash   Clear tape   Betamethasone    unknown   Doxycycline    unknown   Imuran [azathioprine] Rash      Medication List    TAKE these medications   AMITIZA 24 MCG capsule Generic drug:  lubiprostone Take 24 mcg by mouth 2 (two) times daily.   atorvastatin 20 MG tablet Commonly known as:  LIPITOR Take 20 mg by mouth daily.   carisoprodol 350 MG tablet Commonly known as:  SOMA Take 350 mg by mouth 3 (three) times daily as needed for muscle spasms.   cholecalciferol 1000  units tablet Commonly known as:  VITAMIN D Take 1,000 Units by mouth daily.   clonazePAM 0.5 MG tablet Commonly known as:  KLONOPIN Take 0.025-0.5 mg by mouth 3 (three) times daily.   colchicine 0.6 MG tablet Take 0.6 mg by mouth daily.   DULoxetine 60 MG capsule Commonly known as:  CYMBALTA Take 60 mg by mouth 2 (two) times daily.   esomeprazole 40 MG capsule Commonly known as:  NEXIUM Take 40 mg by mouth 2 (two) times daily before a meal.   folic acid 1 MG tablet Commonly known as:  FOLVITE Take 1 mg by mouth daily.   HYDROcodone-acetaminophen 5-325 MG tablet Commonly known as:  NORCO/VICODIN Take 1 tablet by mouth every 8 (eight) hours as needed.   hydrocortisone 25 MG suppository Commonly known as:  ANUSOL-HC Place 25 mg rectally 2 (two) times daily as needed for hemorrhoids.   HYDROmorphone 4 MG tablet Commonly known as:  DILAUDID Take 1 tablet (4 mg total) by mouth every 3 (three) hours as needed for severe pain.   hydroxychloroquine 200 MG tablet Commonly known as:  PLAQUENIL Take 400 mg by mouth daily.   methotrexate 1 g injection Commonly known as:  50 mg/ml Inject 25 mg into the muscle once a week.   metoprolol succinate 25 MG 24 hr tablet Commonly known as:  TOPROL-XL Take 25 mg by mouth daily.   mupirocin ointment 2 % Commonly known as:  BACTROBAN Place 1 application into the nose 2 (two) times daily. Last dose over  week ago   ondansetron 8 MG tablet Commonly known as:  ZOFRAN Take 8 mg by mouth every 8 (eight) hours as needed for nausea or vomiting.   Oxycodone HCl 10 MG Tabs Take 1 tablet (10 mg total) by mouth every 4 (four) hours as needed for severe pain.   oxyCODONE 15 MG immediate release tablet Commonly known as:  ROXICODONE Take 1 tablet (15 mg total) by mouth every 4 (four) hours as needed for moderate pain or severe pain.   pentoxifylline 400 MG CR tablet Commonly known as:  TRENTAL Take 400 mg by mouth 2 (two) times daily.    polyethylene glycol powder powder Commonly known as:  GLYCOLAX/MIRALAX Take 1 Container by mouth as needed.   predniSONE 5 MG tablet Commonly known as:  DELTASONE Take 10 mg by mouth daily with breakfast.   QUEtiapine 50 MG tablet Commonly known as:  SEROQUEL Take 50 mg by mouth 2 (two) times daily.        Signed: Aaniyah Strohm P 02/05/2018, 5:29 PM

## 2018-02-05 NOTE — H&P (Signed)
Dana Bond is an 52 y.o. female.   Chief Complaint: Back and left leg pain HPI: Patient is a very pleasant 52 year old female with a long-standing back and neck pain previously undergone L5-S1 discectomy on the right now has had progressive worsening left leg pain rating down the bottom of foot consistent with an S1 nerve root pattern.  Work-up revealed a large disc herniation at L5-S1 left.  Patient does not have any midline back pain does not have any evidence of instability and has isolated radiculopathy so I recommended a microdiscectomy on the left at L5-S1.  I have extensively gone over the risks and benefits of the procedure with her as well as perioperative course expectations of outcome and alternatives of surgery and she understands and agrees to proceed forward.  Past Medical History:  Diagnosis Date  . Anemia    takes iron  . Anxiety   . Arthritis   . Asthma    mild - no inhaler use in last 3 months  . Costochondritis   . Depression   . DVT (deep venous thrombosis) (HCC) 2012   post op  . Family history of anesthesia complication    Mother N/V  . Fibromyalgia   . GERD (gastroesophageal reflux disease)   . H/O hiatal hernia   . Headache(784.0)   . Hemorrhoid   . Hyperlipemia   . Lupus (HCC)   . Mental disorder   . Migraines   . Peripheral vascular disease (HCC)    on trental  . Pleurisy   . Pneumonia    hx    Past Surgical History:  Procedure Laterality Date  . ABDOMINAL HYSTERECTOMY     Partial  . APPENDECTOMY    . BACK SURGERY     lower disectomy  . CARPAL TUNNEL RELEASE Right   . CERVICAL SPINE SURGERY     ACDF x 2  . CESAREAN SECTION  94  . CHOLECYSTECTOMY    . FOOT SURGERY Right    nerve removal  . LUMBAR LAMINECTOMY/DECOMPRESSION MICRODISCECTOMY Right 05/29/2013   Procedure: LUMBAR LAMINECTOMY/DECOMPRESSION MICRODISCECTOMY 1 LEVEL;  Surgeon: Mariam Dollar, MD;  Location: [facility];  Service: Neurosurgery;  Laterality: Right;  LUMBAR  LAMINECTOMY/DECOMPRESSION MICRODISCECTOMY 1 LEVEL  . POSTERIOR CERVICAL FUSION/FORAMINOTOMY N/A 02/10/2014   Procedure: POSTERIOR CERVICAL FUSION/FORAMINOTOMY CERVICAL FIVE-SIX,CERVICAL SIX-,SEVEN;  Surgeon: Mariam Dollar, MD;  Location: [facility];  Service: Neurosurgery;  Laterality: N/A;  . POSTERIOR CERVICAL FUSION/FORAMINOTOMY N/A 04/23/2014   Procedure: Posterior Cervical Laminectomy/Multi Level - C5-C6 - C6-C7 - C7-T1 removal hardware ;  Surgeon: Mariam Dollar, MD;  Location: [facility] OR;  Service: Neurosurgery;  Laterality: N/A;  . TONSILLECTOMY     and adneodis    No family history on file. Social History:  reports that she quit smoking about 10 years ago. Her smoking use included cigarettes. She has a 5.00 pack-year smoking history. She does not have any smokeless tobacco history on file. She reports that she does not drink alcohol or use drugs.  Allergies:  Allergies  Allergen Reactions  . Augmentin [Amoxicillin-Pot Clavulanate] Anaphylaxis  . Ciprofloxacin Anaphylaxis  . Lyrica [Pregabalin] Anaphylaxis  . Adhesive [Tape] Rash    Clear tape  . Gabapentin Swelling  . Imuran [Azathioprine] Rash    Medications Prior to Admission  Medication Sig Dispense Refill  . atorvastatin (LIPITOR) 20 MG tablet Take 20 mg by mouth daily.    . carisoprodol (SOMA) 350 MG tablet Take 350 mg by mouth 3 (three) times  daily as needed for muscle spasms.    . cholecalciferol (VITAMIN D) 1000 UNITS tablet Take 1,000 Units by mouth daily.    . clonazePAM (KLONOPIN) 0.5 MG tablet Take 0.025-0.5 mg by mouth 3 (three) times daily.    . colchicine 0.6 MG tablet Take 0.6 mg by mouth daily.    . DULoxetine (CYMBALTA) 60 MG capsule Take 60 mg by mouth 2 (two) times daily.    Marland Kitchen. esomeprazole (NEXIUM) 40 MG capsule Take 40 mg by mouth 2 (two) times daily before a meal.    . folic acid (FOLVITE) 1 MG tablet Take 1 mg by mouth daily.    Marland Kitchen. HYDROcodone-acetaminophen (NORCO/VICODIN) 5-325 MG tablet Take 1 tablet by mouth  every 8 (eight) hours as needed.  0  . hydroxychloroquine (PLAQUENIL) 200 MG tablet Take 400 mg by mouth daily.     Marland Kitchen. lubiprostone (AMITIZA) 24 MCG capsule Take 24 mcg by mouth 2 (two) times daily.    . methotrexate (50 MG/ML) 1 G injection Inject 25 mg into the muscle once a week.     . metoprolol succinate (TOPROL-XL) 25 MG 24 hr tablet Take 25 mg by mouth daily.  2  . ondansetron (ZOFRAN) 8 MG tablet Take 8 mg by mouth every 8 (eight) hours as needed for nausea or vomiting.    . pentoxifylline (TRENTAL) 400 MG CR tablet Take 400 mg by mouth 2 (two) times daily.    . polyethylene glycol powder (GLYCOLAX/MIRALAX) powder Take 1 Container by mouth as needed.    . predniSONE (DELTASONE) 5 MG tablet Take 10 mg by mouth daily with breakfast.     . QUEtiapine (SEROQUEL) 50 MG tablet Take 50 mg by mouth 2 (two) times daily.    . hydrocortisone (ANUSOL-HC) 25 MG suppository Place 25 mg rectally 2 (two) times daily as needed for hemorrhoids.    Marland Kitchen. HYDROmorphone (DILAUDID) 4 MG tablet Take 1 tablet (4 mg total) by mouth every 3 (three) hours as needed for severe pain. (Patient not taking: Reported on 02/01/2018) 60 tablet 0  . mupirocin ointment (BACTROBAN) 2 % Place 1 application into the nose 2 (two) times daily. Last dose over week ago    . oxyCODONE (ROXICODONE) 15 MG immediate release tablet Take 1 tablet (15 mg total) by mouth every 4 (four) hours as needed for moderate pain or severe pain. (Patient not taking: Reported on 02/01/2018) 80 tablet 0  . Oxycodone HCl (ROXICODONE) 10 MG TABS Take 1 tablet (10 mg total) by mouth every 4 (four) hours as needed for severe pain. (Patient not taking: Reported on 02/01/2018) 80 tablet 0    No results found for this or any previous visit (from the past 48 hour(s)). No results found.  Review of Systems  Musculoskeletal: Positive for back pain.  Neurological: Positive for tingling.    Blood pressure 129/74, pulse 89, temperature 97.9 F (36.6 C), temperature  source Oral, resp. rate 20, height 5\' 8"  (1.727 m), weight 117.9 kg, SpO2 94 %. Physical Exam  Constitutional: She is oriented to person, place, and time. She appears well-developed and well-nourished.  HENT:  Head: Normocephalic.  Eyes: Pupils are equal, round, and reactive to light.  Neck: Normal range of motion.  Cardiovascular: Normal rate.  Respiratory: Effort normal.  GI: Soft. Bowel sounds are normal.  Neurological: She is alert and oriented to person, place, and time. She has normal strength. GCS eye subscore is 4. GCS verbal subscore is 5. GCS motor subscore is 6.  Strength 5 out of 5 except for some slight plantar flexion weakness left foot.  Skin: Skin is warm and dry.     Assessment/Plan 52 year old female presents for left-sided microdiscectomy L5-S1  Jahbari Repinski P, MD 02/05/2018, 7:15 AM

## 2018-02-05 NOTE — Discharge Instructions (Signed)

## 2018-02-05 NOTE — Op Note (Signed)
Preoperative diagnosis: Left S1 radiculopathy from herniated nucleus pulposus L5-S1 left  Postoperative diagnosis: Same  Procedure: Left-sided L5-S1 laminectomy microdiscectomy with microdissection of the left S1 nerve root microscopic discectomy.  Surgeon: Kary Kos  Assistant: Deatra Ina  Anesthesia: General  EBL: Minimal  HPI: 52 year old female with long-standing neck and back issues presented with progressive worsening back and left leg pain rating down and S1 nerve panel work-up revealed a large disc herniation L5-S1 left.  Due to patient failed conservative treatment imaging findings and progression of clinical syndrome I recommended laminectomy for discectomy at that level.  I extensively reviewed the risks and benefits of the operation with the patient as well as perioperative course expectations of outcome and alternatives of surgery and she understood and agreed to proceed forward.    Operative procedure: Patient brought in the ER was induced under general anesthesia positioned prone Wilson frame her back was prepped and draped in routine sterile fashion.  Her old incision was opened up scar tissue was dissected free subperiosteal dissection carried lamina of L5-S1 the left intraoperative x-ray confirmed medication appropriate levels of the inferior aspect of the lamina L5 medial facet complex and superior aspect of lamina of S1 was drilled out with a high-speed drill laminotomy was begun with a 3 and 4 mm Kerrison punch.  Identified the thecal sac and under MAC scopic illumination identified the S1 nerve root unroofed the S1 foramen with a nerve hook dissector the S1 nerve root off of a large free fragment disc herniation.  I then extended the annulotomy cleaned of the disc space with pituitary rongeurs and Epstein curettes.  I explored the axilla of the nerve root and the medial aspect of the canal no further fragments were appreciated was an copious irrigated to Kassim states was  maintained and the nerve root was freely mobile with a coronary dilator angle hockey-stick in the foramen and the wound was then closed with interrupted Vicryl in a running 4 subcuticular Dermabond benzoin Steri-Strips and a sterile dressing was applied.  Patient recovery room in stable condition.  At the end the case on account sponge counts were correct.

## 2018-02-05 NOTE — Progress Notes (Signed)
Patient is discharged from room 3C11 at this time. Alert and in stable condition. IV site d/c'd and instructions read to patient and son with understanding verbalized. Left unit via wheelchair with all belongings at side. 

## 2018-02-06 ENCOUNTER — Encounter (HOSPITAL_COMMUNITY): Payer: Self-pay | Admitting: Neurosurgery

## 2019-02-05 ENCOUNTER — Other Ambulatory Visit: Payer: Self-pay | Admitting: Neurosurgery

## 2019-02-21 NOTE — Pre-Procedure Instructions (Signed)
[place], [place] [address] [place] [address] Phone: [phone] Fax: [place] [address] [place] [address] [place] [address] Phone: [phone] Fax: [phone]      Your procedure is scheduled on Monday, October 5th.  Report to [facility] Main Entrance "A" at 9:35 A.M., and check in at the Admitting office.  Call this number if you have problems the morning of surgery:  [phone]  Call [phone] if you have any questions prior to your surgery date Monday-Friday 8am-4pm    Remember:  Do not eat or drink after midnight the night before your surgery   Take these medicines the morning of surgery with A SIP OF WATER  atorvastatin (LIPITOR)  clonazePAM (KLONOPIN)  colchicine  DULoxetine (CYMBALTA) esomeprazole (NEXIUM) hydroxychloroquine (PLAQUENIL) lubiprostone (AMITIZA)  metoprolol succinate (TOPROL-XL)  predniSONE (DELTASONE)  QUEtiapine (SEROQUEL)  As needed: carisoprodol (SOMA) and HYDROcodone-acetaminophen (NORCO/VICODIN)    Follow your surgeon's instructions on when to stop/resume pentoxifylline (TRENTAL).  If no instructions were given by your surgeon then you will need to call the office to get those instructions.    As of today, STOP taking any Aspirin (unless otherwise instructed by your surgeon), Aleve, Naproxen, Ibuprofen, Motrin, Advil, Goody's, BC's, all herbal medications, fish oil, and all vitamins.    The Morning of Surgery  Do not wear jewelry, make-up or nail polish.  Do not wear lotions, powders, or perfumes, or deodorant  Do not shave 48 hours prior to surgery.    Do not bring valuables to the hospital.  [facility] is not responsible for any belongings or valuables.  If you are a smoker, DO NOT Smoke 24 hours prior to surgery IF you wear a CPAP at night please bring your mask, tubing, and machine the morning of surgery    Remember that you must have someone to transport you home after your surgery, and remain with you for 24 hours if you are discharged the same day.   Contacts, glasses, hearing aids, dentures or bridgework may not be worn into surgery.    Leave your suitcase in the car.  After surgery it may be brought to your room.  For patients admitted to the hospital, discharge time will be determined by your treatment team.  Patients discharged the day of surgery will not be allowed to drive home.    Special instructions:   - Preparing For Surgery  Before surgery, you can play an important role. Because skin is not sterile, your skin needs to be as free of germs as possible. You can reduce the number of germs on your skin by washing with CHG (chlorahexidine gluconate) Soap before surgery.  CHG is an antiseptic cleaner which kills germs and bonds with the skin to continue killing germs even after washing.    Oral Hygiene is also important to reduce your risk of infection.  Remember - BRUSH YOUR TEETH THE MORNING OF SURGERY WITH YOUR REGULAR TOOTHPASTE  Please do not use if you have an allergy to CHG or antibacterial soaps. If your skin becomes reddened/irritated stop using the CHG.  Do not shave (including legs and underarms) for at least 48 hours prior to first CHG shower. It is OK to shave your face.  Please follow these instructions carefully.   1. Shower the NIGHT BEFORE SURGERY and the MORNING OF SURGERY with CHG Soap.   2. If you chose to wash your  hair, wash your hair first as usual with your normal shampoo.  3. After you shampoo, rinse your hair and body thoroughly to remove the shampoo.  4. Use CHG as you would any other liquid soap. You can apply CHG directly to the skin and wash gently with a scrungie or a clean washcloth.   5. Apply the CHG Soap to your body ONLY FROM THE NECK DOWN.  Do not use on open wounds or open sores. Avoid contact with your eyes, ears, mouth and  genitals (private parts). Wash Face and genitals (private parts)  with your normal soap.   6. Wash thoroughly, paying special attention to the area where your surgery will be performed.  7. Thoroughly rinse your body with warm water from the neck down.  8. DO NOT shower/wash with your normal soap after using and rinsing off the CHG Soap.  9. Pat yourself dry with a CLEAN TOWEL.  10. Wear CLEAN PAJAMAS to bed the night before surgery, wear comfortable clothes the morning of surgery  11. Place CLEAN SHEETS on your bed the night of your first shower and DO NOT SLEEP WITH PETS.    Day of Surgery:  Do not apply any deodorants/lotions. Please shower the morning of surgery with the CHG soap  Please wear clean clothes to the hospital/surgery center.   Remember to brush your teeth WITH YOUR REGULAR TOOTHPASTE.   Please read over the following fact sheets that you were given.

## 2019-02-22 ENCOUNTER — Encounter (HOSPITAL_COMMUNITY): Payer: Self-pay

## 2019-02-22 ENCOUNTER — Encounter (HOSPITAL_COMMUNITY)
Admission: RE | Admit: 2019-02-22 | Discharge: 2019-02-22 | Disposition: A | Discharge status: Home / Self Care | Payer: Medicare HMO | Source: Ambulatory Visit | Attending: Neurosurgery | Admitting: Neurosurgery

## 2019-02-22 ENCOUNTER — Other Ambulatory Visit: Payer: Self-pay

## 2019-02-22 ENCOUNTER — Other Ambulatory Visit (HOSPITAL_COMMUNITY)
Admission: RE | Admit: 2019-02-22 | Discharge: 2019-02-22 | Disposition: A | Discharge status: Home / Self Care | Payer: Medicare HMO | Source: Ambulatory Visit | Attending: Neurosurgery | Admitting: Neurosurgery

## 2019-02-22 HISTORY — DX: Other abnormal glucose: R73.09

## 2019-02-22 LAB — CBC
HCT: 42.5 % (ref 36.0–46.0)
Hemoglobin: 14.2 g/dL (ref 12.0–15.0)
MCH: 28.9 pg (ref 26.0–34.0)
MCHC: 33.4 g/dL (ref 30.0–36.0)
MCV: 86.4 fL (ref 80.0–100.0)
Platelets: 360 10*3/uL (ref 150–400)
RBC: 4.92 MIL/uL (ref 3.87–5.11)
RDW: 14.3 % (ref 11.5–15.5)
WBC: 12.1 10*3/uL — ABNORMAL HIGH (ref 4.0–10.5)
nRBC: 0 % (ref 0.0–0.2)

## 2019-02-22 LAB — BASIC METABOLIC PANEL
Anion gap: 13 (ref 5–15)
BUN: 12 mg/dL (ref 6–20)
CO2: 22 mmol/L (ref 22–32)
Calcium: 9.7 mg/dL (ref 8.9–10.3)
Chloride: 104 mmol/L (ref 98–111)
Creatinine, Ser: 0.83 mg/dL (ref 0.44–1.00)
GFR calc Af Amer: >60 mL/min (ref >60)
GFR calc non Af Amer: >60 mL/min (ref >60)
Glucose, Bld: 214 mg/dL — ABNORMAL HIGH (ref 70–99)
Potassium: 4 mmol/L (ref 3.5–5.1)
Sodium: 139 mmol/L (ref 135–145)

## 2019-02-22 LAB — HEMOGLOBIN A1C
Hgb A1c MFr Bld: 7.7 % — ABNORMAL HIGH (ref 4.8–5.6)
Mean Plasma Glucose: 174.29 mg/dL

## 2019-02-22 LAB — TYPE AND SCREEN
ABO/RH(D): A NEG
Antibody Screen: NEGATIVE

## 2019-02-22 LAB — SURGICAL PCR SCREEN
MRSA, PCR: NEGATIVE
Staphylococcus aureus: NEGATIVE

## 2019-02-22 NOTE — Anesthesia Preprocedure Evaluation (Addendum)
Anesthesia Evaluation  Patient identified by MRN, date of birth, ID band Patient awake    Reviewed: Allergy & Precautions, NPO status , Patient's Chart, lab work & pertinent test results  Airway Mallampati: II  TM Distance: >3 FB Neck ROM: Full    Dental  (+) Teeth Intact, Dental Advisory Given   Pulmonary former smoker,    breath sounds clear to auscultation       Cardiovascular  Rhythm:Regular Rate:Normal     Neuro/Psych    GI/Hepatic   Endo/Other    Renal/GU      Musculoskeletal   Abdominal   Peds  Hematology   Anesthesia Other Findings   Reproductive/Obstetrics                           Anesthesia Physical Anesthesia Plan  ASA: III  Anesthesia Plan: General   Post-op Pain Management:    Induction: Intravenous  PONV Risk Score and Plan: Ondansetron  Airway Management Planned: Oral ETT  Additional Equipment:   Intra-op Plan:   Post-operative Plan: Extubation in OR  Informed Consent: I have reviewed the patients History and Physical, chart, labs and discussed the procedure including the risks, benefits and alternatives for the proposed anesthesia with the patient or authorized representative who has indicated his/her understanding and acceptance.       Plan Discussed with: Anesthesiologist and CRNA  Anesthesia Plan Comments: (See PAT note written 02/22/2019 by Myra Gianotti, PA-C. )       Anesthesia Quick Evaluation

## 2019-02-22 NOTE — Progress Notes (Addendum)
Anesthesia Chart Review:  Case: [id number] Date/Time: 02/25/19 1120   Procedure: PLIF - L5-S1 (N/A Back)   Anesthesia type: General   Pre-op diagnosis: HNP   [facility] / [facility]   Surgeon: Donalee Citrinram, Gary, MD      DISCUSSION: Patient is a 53 year old female scheduled for the above procedure.  History includes former smoker (quit 2008), lupus (SLE), HLD, GERD, fibromyalgia, hiatal hernia, DVT (post op 2012), asthma, anemia, PVD, back surgeries (redo L5-S1 laminectomy/microdiscectomy 05/29/13; left L5-S1 laminectomy/microdiscectomy 02/05/18), neck surgeries (C5-7 ACDF 04/13/09; C3-5 ACDF, removal of hardware C5-7 08/12/09; posterior C5-7 fusion 02/10/14; Re-exploration posterior C5-7 fusion with removal of hardware, placement of bilateral T1 pedicle screws and posterior cervical laminectomy/foraominotoimes C7-T1 04/23/14). BMI is consistent with obesity.   BMET apparently resulted about 4:30 PM today and reviewed by myself at around 4:45 PM Friday. Non-fasting glucose was 214. A1c done and was elevated at 7.7%. I notified Erie NoeVanessa at Dr. Lonie Peakram's office just before 5:00 PM because I didn't see clear documentation of hyperglycemia or diabetes history--although patient had reported to our staff intermittent monitoring of home CBGs. I then contacted patient to further discuss. She reported that she did not have a formal diagnosis of diabetes (or pre-diabetes to her knowledge) but has a strong family history of diabetes and due to his history and chronic steroid use, she checks a random CBG about weekly with results typically 90's-low 100's (sometimes up to 140). She is on daily prednisone 5 mg for SLE, but was started also on a Medrol dose pack by Dr. Wynetta Emeryram earlier this week (last dose will be 02/23/19). Explained that current A1c result of 7.7% is consistent with a diagnosis of diabetes with average glucose of ~ 170 and that while her Medrol taper may be driving her sugars up acutely she has likely had elevated  readings for longer. Given parents had diabetes, she is fairly familiar with ADA dietary recommendations. Advised that she will need to follow-up with PCP in the near future (since it was now after 5:00 PM and surgery is Monday, I cannot contact). Discussed that she will get a fasting CBG on arrival. Plans to proceed could depend on those results. Also may need Hospitalist and/or Diabetes Coordinator to see while hospitalized.   EKG showed mild ST. Patient is on a Medrol dose pack. She reports that she has a history of tachycardia when on higher steroids doses. For example, she has been on prednisone for 11 years. Initially she was on 10 mg daily, but due to HR as high as the 130's her rheumatologist decreased the dose to 5 mg daily about 2 years ago. Denied CP and SOB at PAT. She reports activity is currently very limited due to her severe back pain.   Anesthesia team to evaluate on the day of surgery. As above, plans to proceed may depend on CBG results. Will preemptively enter order for  DM Coordinator evaluation (which would occur post-operative if surgery proceeds as planned).    VS: BP 125/76   Pulse (!) 104 Comment: taken manually/notified Magazine features editorChristine RN  Temp 37.4 C   Resp 20   Ht 5\' 8"  (1.727 m)   Wt 123.4 kg Comment: patient states that it is difficult to stand. She said that her last weight was on 02/05/19  SpO2 97%   BMI 41.36 kg/m    PROVIDERS: Ladora DanielBeal, Sheri, PA-C is PCP. See [facility] Everywhere. She is aware of surgery plans.  Geraldine ContrasZiolkowska, Alonda, MD is rheumatologist (  [facility] Everywhere)   LABS: Preoperative labs noted. See DISCUSSION. (all labs ordered are listed, but only abnormal results are displayed)  Labs Reviewed  CBC - Abnormal; Notable for the following components:      Result Value   WBC 12.1 (*)    All other components within normal limits  HEMOGLOBIN A1C - Abnormal; Notable for the following components:   Hgb A1c MFr Bld 7.7 (*)    All other components  within normal limits  BASIC METABOLIC PANEL - Abnormal; Notable for the following components:   Glucose, Bld 214 (*)    All other components within normal limits  SURGICAL PCR SCREEN  TYPE AND SCREEN     EKG: EKG: Sinus tachycardia at 109 bpm Minimal voltage criteria for LVH, may be normal variant ( R in aVL ) Cannot rule out Inferior infarct , age undetermined Abnormal ECG - Rate is faster when compared to 02/01/18 tracing   CV:  Stress echo 02/23/09 [facility], scanned under Media tab): Conclusions Normal hyperkinetic response in all left ventricular wall segments noted.  Normal stress echo.   Past Medical History:  Diagnosis Date  . Anemia    takes iron  . Anxiety   . Arthritis   . Asthma    mild - no inhaler use in last 3 months  . Costochondritis   . Depression   . DVT (deep venous thrombosis) (HCC) 2012   post op  . Elevated hemoglobin A1c    7.7% 02/22/19  . Family history of anesthesia complication    Mother N/V  . Fibromyalgia   . GERD (gastroesophageal reflux disease)   . H/O hiatal hernia   . Headache(784.0)   . Hemorrhoid   . Hyperlipemia   . Lupus (HCC)   . Mental disorder   . Migraines   . Peripheral vascular disease (HCC)    on trental  . Pleurisy   . Pneumonia    hx    Past Surgical History:  Procedure Laterality Date  . ABDOMINAL HYSTERECTOMY     Partial  . APPENDECTOMY    . BACK SURGERY     lower disectomy  . CARPAL TUNNEL RELEASE Right   . CERVICAL SPINE SURGERY     ACDF x 2  . CESAREAN SECTION  94  . CHOLECYSTECTOMY    . FOOT SURGERY Right    nerve removal  . LUMBAR LAMINECTOMY/DECOMPRESSION MICRODISCECTOMY Right 05/29/2013   Procedure: LUMBAR LAMINECTOMY/DECOMPRESSION MICRODISCECTOMY 1 LEVEL;  Surgeon: Mariam Dollar, MD;  [facility];  Service: Neurosurgery;  Laterality: Right;  LUMBAR LAMINECTOMY/DECOMPRESSION MICRODISCECTOMY 1 LEVEL  . LUMBAR LAMINECTOMY/DECOMPRESSION MICRODISCECTOMY Left 02/05/2018    Procedure: Microdiscectomy - Lumbar Five-Sacral One - left;  Surgeon: Donalee Citrin, MD;  Location: [facility] OR;  Service: Neurosurgery;  Laterality: Left;  Microdiscectomy - Lumbar Five-Sacral One - left  . POSTERIOR CERVICAL FUSION/FORAMINOTOMY N/A 02/10/2014   Procedure: POSTERIOR CERVICAL FUSION/FORAMINOTOMY CERVICAL FIVE-SIX,CERVICAL SIX-,SEVEN;  Surgeon: Mariam Dollar, MD;  Location[facility] NE[facility];  Service: Neurosurgery;  Laterality: N/A;  . POSTERIOR CERVICAL FUSION/FORAMINOTOMY N/A 04/23/2014   Procedure: Posterior Cervical Laminectomy/Multi Level - C5-C6 - C6-C7 - C7-T1 removal hardware ;  Surgeon: Mariam Dollar, MD;  Location: [facility] OR;  Service: Neurosurgery;  Laterality: N/A;  . TONSILLECTOMY     and adneodis  . TUBAL LIGATION     30 years ago    MEDICATIONS: . atorvastatin (LIPITOR) 20 MG tablet  . carisoprodol (SOMA) 350 MG tablet  .  cholecalciferol (VITAMIN D) 1000 UNITS tablet  . clonazePAM (KLONOPIN) 0.5 MG tablet  . colchicine 0.6 MG tablet  . DULoxetine (CYMBALTA) 60 MG capsule  . esomeprazole (NEXIUM) 40 MG capsule  . folic acid (FOLVITE) 1 MG tablet  . HYDROcodone-acetaminophen (NORCO/VICODIN) 5-325 MG tablet  . hydrocortisone (ANUSOL-HC) 25 MG suppository  . HYDROmorphone (DILAUDID) 4 MG tablet  . hydroxychloroquine (PLAQUENIL) 200 MG tablet  . lubiprostone (AMITIZA) 24 MCG capsule  . methotrexate (50 MG/ML) 1 G injection  . metoprolol succinate (TOPROL-XL) 25 MG 24 hr tablet  . mupirocin ointment (BACTROBAN) 2 %  . ondansetron (ZOFRAN) 8 MG tablet  . oxyCODONE (ROXICODONE) 15 MG immediate release tablet  . Oxycodone HCl (ROXICODONE) 10 MG TABS  . pentoxifylline (TRENTAL) 400 MG CR tablet  . polyethylene glycol powder (GLYCOLAX/MIRALAX) powder  . predniSONE (DELTASONE) 5 MG tablet  . QUEtiapine (SEROQUEL) 50 MG tablet   No current facility-administered medications for this encounter.   Last Trental 02/18/19.   Myra Gianotti, PA-C Surgical Short Stay/Anesthesiology [facility]  Phone [phone] [facility] Phone [phone] 02/22/2019 5:31 PM

## 2019-02-22 NOTE — Progress Notes (Addendum)
PCP - Nicholes Rough, PA-C Cardiologist - Denies Orthopedic Surgeon- Unknown Foley, MD Rheumatologist- Neva Seat, MD Neurosurgeon- Kary Kos, MD   PPM/ICD - Denies Device Orders - Denies Rep Notified N/A  Chest x-ray - N/A EKG -  02/22/2019 Stress Test - 10+ years ago at [facility] ECHO - N/A Cardiac Cath - N/A  Sleep Study - Denies CPAP - N/A  Fasting Blood Sugar  90- 103 Checks Blood Sugar __one___ time a day  Blood Thinner Instructions: Stop Trental 7 days prior to surgery, last dose 02/18/2019 Aspirin Instructions: N/A  ERAS Protcol - N/A PRE-SURGERY Ensure - N/A  COVID TEST- 02/22/2019   Anesthesia review: Yes, Review EKG  Patient denies shortness of breath, fever, cough and chest pain at PAT appointment   Coronavirus Screening  Have you experienced the following symptoms:  Cough yes/no: No Fever (>100.68F)  yes/no: No Runny nose yes/no: No Sore throat yes/no: No Difficulty breathing/shortness of breath  yes/no: No  Have you or a family member traveled in the last 14 days and where? yes/no: No   If the patient indicates "YES" to the above questions, their PAT will be rescheduled to limit the exposure to others and, the surgeon will be notified. THE PATIENT WILL NEED TO BE ASYMPTOMATIC FOR 14 DAYS.   If the patient is not experiencing any of these symptoms, the PAT nurse will instruct them to NOT bring anyone with them to their appointment since they may have these symptoms or traveled as well.   Please remind your patients and families that hospital visitation restrictions are in effect and the importance of the restrictions.     Patient verbalized understanding of instructions that were given to them at the PAT appointment. Patient was also instructed that they will need to review over the PAT instructions again at home before surgery.

## 2019-02-24 LAB — NOVEL CORONAVIRUS, NAA (HOSP ORDER, SEND-OUT TO REF LAB; TAT 18-24 HRS): SARS-CoV-2, NAA: NOT DETECTED

## 2019-02-25 ENCOUNTER — Inpatient Hospital Stay (HOSPITAL_COMMUNITY): Payer: Medicare HMO | Admitting: Critical Care Medicine

## 2019-02-25 ENCOUNTER — Inpatient Hospital Stay (HOSPITAL_COMMUNITY)
Admission: RE | Admit: 2019-02-25 | Discharge: 2019-02-26 | DRG: 460 | Disposition: A | Discharge status: Home / Self Care | Payer: Medicare HMO | Attending: Neurosurgery | Admitting: Neurosurgery

## 2019-02-25 ENCOUNTER — Inpatient Hospital Stay (HOSPITAL_COMMUNITY): Payer: Medicare HMO | Admitting: Physician Assistant

## 2019-02-25 ENCOUNTER — Other Ambulatory Visit: Payer: Self-pay

## 2019-02-25 ENCOUNTER — Encounter (HOSPITAL_COMMUNITY): Payer: Self-pay

## 2019-02-25 ENCOUNTER — Other Ambulatory Visit (HOSPITAL_COMMUNITY): Payer: Medicare HMO

## 2019-02-25 ENCOUNTER — Encounter (HOSPITAL_COMMUNITY)
Admission: RE | Disposition: A | Discharge status: Home / Self Care | Payer: Self-pay | Source: Home / Self Care | Attending: Neurosurgery

## 2019-02-25 ENCOUNTER — Inpatient Hospital Stay (HOSPITAL_COMMUNITY): Payer: Medicare HMO

## 2019-02-25 DIAGNOSIS — M5127 Other intervertebral disc displacement, lumbosacral region: Principal | ICD-10-CM | POA: Diagnosis present

## 2019-02-25 DIAGNOSIS — Z20828 Contact with and (suspected) exposure to other viral communicable diseases: Secondary | ICD-10-CM | POA: Diagnosis present

## 2019-02-25 DIAGNOSIS — Z23 Encounter for immunization: Secondary | ICD-10-CM

## 2019-02-25 DIAGNOSIS — M48061 Spinal stenosis, lumbar region without neurogenic claudication: Secondary | ICD-10-CM | POA: Diagnosis present

## 2019-02-25 DIAGNOSIS — R402253 Coma scale, best verbal response, oriented, at hospital admission: Secondary | ICD-10-CM | POA: Diagnosis present

## 2019-02-25 DIAGNOSIS — M797 Fibromyalgia: Secondary | ICD-10-CM | POA: Diagnosis present

## 2019-02-25 DIAGNOSIS — M329 Systemic lupus erythematosus, unspecified: Secondary | ICD-10-CM | POA: Diagnosis present

## 2019-02-25 DIAGNOSIS — E785 Hyperlipidemia, unspecified: Secondary | ICD-10-CM | POA: Diagnosis present

## 2019-02-25 DIAGNOSIS — J939 Pneumothorax, unspecified: Secondary | ICD-10-CM

## 2019-02-25 DIAGNOSIS — Z90711 Acquired absence of uterus with remaining cervical stump: Secondary | ICD-10-CM

## 2019-02-25 DIAGNOSIS — Z86718 Personal history of other venous thrombosis and embolism: Secondary | ICD-10-CM

## 2019-02-25 DIAGNOSIS — Z888 Allergy status to other drugs, medicaments and biological substances status: Secondary | ICD-10-CM | POA: Diagnosis not present

## 2019-02-25 DIAGNOSIS — I739 Peripheral vascular disease, unspecified: Secondary | ICD-10-CM | POA: Diagnosis present

## 2019-02-25 DIAGNOSIS — Z419 Encounter for procedure for purposes other than remedying health state, unspecified: Secondary | ICD-10-CM

## 2019-02-25 DIAGNOSIS — Z7952 Long term (current) use of systemic steroids: Secondary | ICD-10-CM

## 2019-02-25 DIAGNOSIS — Z87891 Personal history of nicotine dependence: Secondary | ICD-10-CM | POA: Diagnosis not present

## 2019-02-25 DIAGNOSIS — F329 Major depressive disorder, single episode, unspecified: Secondary | ICD-10-CM | POA: Diagnosis present

## 2019-02-25 DIAGNOSIS — F419 Anxiety disorder, unspecified: Secondary | ICD-10-CM | POA: Diagnosis present

## 2019-02-25 DIAGNOSIS — Z881 Allergy status to other antibiotic agents status: Secondary | ICD-10-CM

## 2019-02-25 DIAGNOSIS — Z880 Allergy status to penicillin: Secondary | ICD-10-CM

## 2019-02-25 DIAGNOSIS — Z981 Arthrodesis status: Secondary | ICD-10-CM | POA: Diagnosis not present

## 2019-02-25 DIAGNOSIS — Z9089 Acquired absence of other organs: Secondary | ICD-10-CM | POA: Diagnosis not present

## 2019-02-25 DIAGNOSIS — Z9049 Acquired absence of other specified parts of digestive tract: Secondary | ICD-10-CM

## 2019-02-25 DIAGNOSIS — Z91048 Other nonmedicinal substance allergy status: Secondary | ICD-10-CM

## 2019-02-25 DIAGNOSIS — Z79899 Other long term (current) drug therapy: Secondary | ICD-10-CM

## 2019-02-25 DIAGNOSIS — K219 Gastro-esophageal reflux disease without esophagitis: Secondary | ICD-10-CM | POA: Diagnosis present

## 2019-02-25 DIAGNOSIS — R402363 Coma scale, best motor response, obeys commands, at hospital admission: Secondary | ICD-10-CM | POA: Diagnosis present

## 2019-02-25 DIAGNOSIS — Z7401 Bed confinement status: Secondary | ICD-10-CM

## 2019-02-25 DIAGNOSIS — R402143 Coma scale, eyes open, spontaneous, at hospital admission: Secondary | ICD-10-CM | POA: Diagnosis present

## 2019-02-25 LAB — GLUCOSE, CAPILLARY: Glucose-Capillary: 139 mg/dL — ABNORMAL HIGH (ref 70–99)

## 2019-02-25 SURGERY — POSTERIOR LUMBAR FUSION 1 LEVEL
Anesthesia: General | Site: Back

## 2019-02-25 MED ORDER — QUETIAPINE FUMARATE 25 MG PO TABS
50.0000 mg | ORAL_TABLET | Freq: Two times a day (BID) | ORAL | Status: DC
  Administered 2019-02-25 – 2019-02-26 (×2): 50 mg via ORAL
  Filled 2019-02-25 (×2): qty 2

## 2019-02-25 MED ORDER — 0.9 % SODIUM CHLORIDE (POUR BTL) OPTIME
TOPICAL | Status: DC | PRN
  Administered 2019-02-25 (×2): 1000 mL

## 2019-02-25 MED ORDER — SODIUM CHLORIDE 0.9% FLUSH: 3.0000 mL | INTRAVENOUS | Status: DC | PRN

## 2019-02-25 MED ORDER — VITAMIN D 25 MCG (1000 UNIT) PO TABS
1000.0000 [IU] | ORAL_TABLET | Freq: Every day | ORAL | Status: DC
  Administered 2019-02-26: 1000 [IU] via ORAL
  Filled 2019-02-25: qty 1

## 2019-02-25 MED ORDER — CLONAZEPAM 0.5 MG PO TABS
0.2500 mg | ORAL_TABLET | Freq: Three times a day (TID) | ORAL | Status: DC
  Administered 2019-02-25 – 2019-02-26 (×3): 0.5 mg via ORAL
  Filled 2019-02-25 (×5): qty 1

## 2019-02-25 MED ORDER — LIDOCAINE-EPINEPHRINE 1 %-1:100000 IJ SOLN
INTRAMUSCULAR | Status: DC | PRN
  Administered 2019-02-25: 10 mL

## 2019-02-25 MED ORDER — SODIUM CHLORIDE 0.9 % IV SOLN
INTRAVENOUS | Status: DC | PRN
  Administered 2019-02-25: 500 mL

## 2019-02-25 MED ORDER — FENTANYL CITRATE (PF) 250 MCG/5ML IJ SOLN
INTRAMUSCULAR | Status: DC | PRN
  Administered 2019-02-25 (×2): 50 ug via INTRAVENOUS
  Administered 2019-02-25 (×2): 25 ug via INTRAVENOUS
  Administered 2019-02-25: 100 ug via INTRAVENOUS
  Administered 2019-02-25 (×3): 50 ug via INTRAVENOUS

## 2019-02-25 MED ORDER — PANTOPRAZOLE SODIUM 40 MG IV SOLR: 40.0000 mg | Freq: Every day | INTRAVENOUS | Status: DC

## 2019-02-25 MED ORDER — ROCURONIUM BROMIDE 10 MG/ML (PF) SYRINGE
PREFILLED_SYRINGE | INTRAVENOUS | Status: AC
  Filled 2019-02-25: qty 20

## 2019-02-25 MED ORDER — ATORVASTATIN CALCIUM 10 MG PO TABS
20.0000 mg | ORAL_TABLET | Freq: Every day | ORAL | Status: DC
  Administered 2019-02-26: 09:00:00 20 mg via ORAL
  Filled 2019-02-25: qty 2

## 2019-02-25 MED ORDER — ONDANSETRON HCL 4 MG/2ML IJ SOLN: 4.0000 mg | Freq: Once | INTRAMUSCULAR | Status: DC | PRN

## 2019-02-25 MED ORDER — OXYCODONE HCL 5 MG PO TABS
10.0000 mg | ORAL_TABLET | ORAL | Status: DC | PRN
  Administered 2019-02-26 (×2): 10 mg via ORAL
  Filled 2019-02-25 (×2): qty 2

## 2019-02-25 MED ORDER — METOPROLOL SUCCINATE ER 25 MG PO TB24
25.0000 mg | ORAL_TABLET | Freq: Every day | ORAL | Status: DC
  Administered 2019-02-26: 25 mg via ORAL
  Filled 2019-02-25: qty 1

## 2019-02-25 MED ORDER — ACETAMINOPHEN 650 MG RE SUPP: 650.0000 mg | RECTAL | Status: DC | PRN

## 2019-02-25 MED ORDER — OXYCODONE HCL 5 MG/5ML PO SOLN: 5.0000 mg | Freq: Once | ORAL | Status: AC | PRN

## 2019-02-25 MED ORDER — LIDOCAINE-EPINEPHRINE 1 %-1:100000 IJ SOLN
INTRAMUSCULAR | Status: AC
  Filled 2019-02-25: qty 1

## 2019-02-25 MED ORDER — SODIUM CHLORIDE 0.9 % IV SOLN
INTRAVENOUS | Status: DC | PRN
  Administered 2019-02-25: 20 ug/min via INTRAVENOUS

## 2019-02-25 MED ORDER — DEXAMETHASONE SODIUM PHOSPHATE 10 MG/ML IJ SOLN
INTRAMUSCULAR | Status: AC
  Filled 2019-02-25: qty 1

## 2019-02-25 MED ORDER — VANCOMYCIN HCL IN DEXTROSE 1-5 GM/200ML-% IV SOLN
INTRAVENOUS | Status: AC
  Filled 2019-02-25: qty 200

## 2019-02-25 MED ORDER — MENTHOL 3 MG MT LOZG: 1.0000 | LOZENGE | OROMUCOSAL | Status: DC | PRN

## 2019-02-25 MED ORDER — LIDOCAINE 2% (20 MG/ML) 5 ML SYRINGE
INTRAMUSCULAR | Status: AC
  Filled 2019-02-25: qty 10

## 2019-02-25 MED ORDER — PROPOFOL 10 MG/ML IV BOLUS
INTRAVENOUS | Status: DC | PRN
  Administered 2019-02-25: 140 mg via INTRAVENOUS

## 2019-02-25 MED ORDER — ONDANSETRON HCL 4 MG/2ML IJ SOLN
INTRAMUSCULAR | Status: AC
  Filled 2019-02-25: qty 2

## 2019-02-25 MED ORDER — MIDAZOLAM HCL 5 MG/5ML IJ SOLN
INTRAMUSCULAR | Status: DC | PRN
  Administered 2019-02-25: 2 mg via INTRAVENOUS

## 2019-02-25 MED ORDER — CYCLOBENZAPRINE HCL 10 MG PO TABS
10.0000 mg | ORAL_TABLET | Freq: Three times a day (TID) | ORAL | Status: DC | PRN
  Administered 2019-02-25: 10 mg via ORAL
  Filled 2019-02-25: qty 1

## 2019-02-25 MED ORDER — HYDROCORTISONE NA SUCCINATE PF 250 MG IJ SOLR
INTRAMUSCULAR | Status: AC
  Filled 2019-02-25: qty 250

## 2019-02-25 MED ORDER — PENTOXIFYLLINE ER 400 MG PO TBCR
400.0000 mg | EXTENDED_RELEASE_TABLET | Freq: Two times a day (BID) | ORAL | Status: DC
  Administered 2019-02-26: 400 mg via ORAL
  Filled 2019-02-25 (×3): qty 1

## 2019-02-25 MED ORDER — HYDROXYCHLOROQUINE SULFATE 200 MG PO TABS
400.0000 mg | ORAL_TABLET | Freq: Every day | ORAL | Status: DC
  Administered 2019-02-26: 11:00:00 400 mg via ORAL
  Filled 2019-02-25: qty 2

## 2019-02-25 MED ORDER — ACETAMINOPHEN 325 MG PO TABS: 650.0000 mg | ORAL_TABLET | ORAL | Status: DC | PRN

## 2019-02-25 MED ORDER — HYDROCORTISONE NA SUCCINATE PF 1000 MG IJ SOLR
INTRAMUSCULAR | Status: DC | PRN
  Administered 2019-02-25: 125 mg via INTRAVENOUS

## 2019-02-25 MED ORDER — ONDANSETRON HCL 4 MG PO TABS: 8.0000 mg | ORAL_TABLET | Freq: Three times a day (TID) | ORAL | Status: DC | PRN

## 2019-02-25 MED ORDER — FOLIC ACID 1 MG PO TABS
1.0000 mg | ORAL_TABLET | Freq: Every day | ORAL | Status: DC
  Administered 2019-02-26: 1 mg via ORAL
  Filled 2019-02-25: qty 1

## 2019-02-25 MED ORDER — LINACLOTIDE 145 MCG PO CAPS
290.0000 ug | ORAL_CAPSULE | Freq: Every day | ORAL | Status: DC
  Filled 2019-02-25 (×2): qty 2

## 2019-02-25 MED ORDER — PANTOPRAZOLE SODIUM 40 MG PO TBEC
40.0000 mg | DELAYED_RELEASE_TABLET | Freq: Every day | ORAL | Status: DC
  Administered 2019-02-26: 40 mg via ORAL
  Filled 2019-02-25: qty 1

## 2019-02-25 MED ORDER — FENTANYL CITRATE (PF) 100 MCG/2ML IJ SOLN
25.0000 ug | INTRAMUSCULAR | Status: DC | PRN
  Administered 2019-02-25 (×4): 25 ug via INTRAVENOUS

## 2019-02-25 MED ORDER — LIDOCAINE 2% (20 MG/ML) 5 ML SYRINGE
INTRAMUSCULAR | Status: AC
  Filled 2019-02-25: qty 5

## 2019-02-25 MED ORDER — ARTIFICIAL TEARS OPHTHALMIC OINT
TOPICAL_OINTMENT | OPHTHALMIC | Status: AC
  Filled 2019-02-25: qty 3.5

## 2019-02-25 MED ORDER — FENTANYL CITRATE (PF) 250 MCG/5ML IJ SOLN
INTRAMUSCULAR | Status: AC
  Filled 2019-02-25: qty 5

## 2019-02-25 MED ORDER — PROPOFOL 10 MG/ML IV BOLUS
INTRAVENOUS | Status: AC
  Filled 2019-02-25: qty 20

## 2019-02-25 MED ORDER — SODIUM CHLORIDE 0.9% FLUSH
3.0000 mL | Freq: Two times a day (BID) | INTRAVENOUS | Status: DC
  Administered 2019-02-25: 3 mL via INTRAVENOUS

## 2019-02-25 MED ORDER — HYDROCORTISONE ACETATE 25 MG RE SUPP
25.0000 mg | Freq: Two times a day (BID) | RECTAL | Status: DC | PRN
  Filled 2019-02-25: qty 1

## 2019-02-25 MED ORDER — HYDROCODONE-ACETAMINOPHEN 5-325 MG PO TABS
1.0000 | ORAL_TABLET | Freq: Three times a day (TID) | ORAL | Status: DC | PRN
  Administered 2019-02-25: 1 via ORAL
  Filled 2019-02-25: qty 1

## 2019-02-25 MED ORDER — ACETAMINOPHEN 10 MG/ML IV SOLN
INTRAVENOUS | Status: DC | PRN
  Administered 2019-02-25: 1000 mg via INTRAVENOUS

## 2019-02-25 MED ORDER — COLCHICINE 0.6 MG PO TABS
0.6000 mg | ORAL_TABLET | Freq: Every day | ORAL | Status: DC
  Administered 2019-02-26: 0.6 mg via ORAL
  Filled 2019-02-25: qty 1

## 2019-02-25 MED ORDER — DULOXETINE HCL 60 MG PO CPEP
60.0000 mg | ORAL_CAPSULE | Freq: Two times a day (BID) | ORAL | Status: DC
  Administered 2019-02-25 – 2019-02-26 (×2): 60 mg via ORAL
  Filled 2019-02-25 (×2): qty 1

## 2019-02-25 MED ORDER — ONDANSETRON HCL 4 MG PO TABS: 4.0000 mg | ORAL_TABLET | Freq: Four times a day (QID) | ORAL | Status: DC | PRN

## 2019-02-25 MED ORDER — OXYCODONE HCL 5 MG PO TABS
5.0000 mg | ORAL_TABLET | Freq: Once | ORAL | Status: AC | PRN
  Administered 2019-02-25: 5 mg via ORAL

## 2019-02-25 MED ORDER — SODIUM CHLORIDE 0.9 % IV SOLN
250.0000 mL | INTRAVENOUS | Status: DC
  Administered 2019-02-25: 250 mL via INTRAVENOUS

## 2019-02-25 MED ORDER — ALUM & MAG HYDROXIDE-SIMETH 200-200-20 MG/5ML PO SUSP: 30.0000 mL | Freq: Four times a day (QID) | ORAL | Status: DC | PRN

## 2019-02-25 MED ORDER — LACTATED RINGERS IV SOLN
Freq: Once | INTRAVENOUS | Status: AC
  Administered 2019-02-25: 11:00:00 via INTRAVENOUS

## 2019-02-25 MED ORDER — THROMBIN 20000 UNITS EX SOLR
CUTANEOUS | Status: AC
  Filled 2019-02-25: qty 20000

## 2019-02-25 MED ORDER — DEXAMETHASONE SODIUM PHOSPHATE 10 MG/ML IJ SOLN
INTRAMUSCULAR | Status: DC | PRN
  Administered 2019-02-25: 4 mg via INTRAVENOUS

## 2019-02-25 MED ORDER — SUGAMMADEX SODIUM 200 MG/2ML IV SOLN
INTRAVENOUS | Status: DC | PRN
  Administered 2019-02-25: 250 mg via INTRAVENOUS

## 2019-02-25 MED ORDER — CHLORHEXIDINE GLUCONATE CLOTH 2 % EX PADS: 6.0000 | MEDICATED_PAD | Freq: Once | CUTANEOUS | Status: DC

## 2019-02-25 MED ORDER — ALBUTEROL SULFATE (2.5 MG/3ML) 0.083% IN NEBU: 3.0000 mL | INHALATION_SOLUTION | RESPIRATORY_TRACT | Status: DC | PRN

## 2019-02-25 MED ORDER — HYDROMORPHONE HCL 1 MG/ML IJ SOLN
1.0000 mg | INTRAMUSCULAR | Status: DC | PRN
  Administered 2019-02-25 – 2019-02-26 (×2): 1 mg via INTRAVENOUS
  Filled 2019-02-25 (×2): qty 1

## 2019-02-25 MED ORDER — ACETAMINOPHEN 10 MG/ML IV SOLN
INTRAVENOUS | Status: AC
  Filled 2019-02-25: qty 100

## 2019-02-25 MED ORDER — FENTANYL CITRATE (PF) 100 MCG/2ML IJ SOLN
INTRAMUSCULAR | Status: AC
  Filled 2019-02-25: qty 2

## 2019-02-25 MED ORDER — OXYCODONE HCL 5 MG PO TABS
ORAL_TABLET | ORAL | Status: AC
  Filled 2019-02-25: qty 1

## 2019-02-25 MED ORDER — PREDNISONE 10 MG PO TABS
10.0000 mg | ORAL_TABLET | Freq: Every day | ORAL | Status: DC
  Administered 2019-02-26: 09:00:00 10 mg via ORAL
  Filled 2019-02-25: qty 1

## 2019-02-25 MED ORDER — CARISOPRODOL 350 MG PO TABS
350.0000 mg | ORAL_TABLET | Freq: Four times a day (QID) | ORAL | Status: DC
  Administered 2019-02-25 – 2019-02-26 (×3): 350 mg via ORAL
  Filled 2019-02-25 (×3): qty 1

## 2019-02-25 MED ORDER — VANCOMYCIN HCL 10 G IV SOLR
1250.0000 mg | Freq: Once | INTRAVENOUS | Status: AC
  Administered 2019-02-25: 23:00:00 1250 mg via INTRAVENOUS
  Filled 2019-02-25: qty 1250

## 2019-02-25 MED ORDER — BUPIVACAINE LIPOSOME 1.3 % IJ SUSP
20.0000 mL | INTRAMUSCULAR | Status: AC
  Administered 2019-02-25: 20 mL
  Filled 2019-02-25: qty 20

## 2019-02-25 MED ORDER — LACTATED RINGERS IV SOLN
INTRAVENOUS | Status: DC | PRN
  Administered 2019-02-25 (×2): via INTRAVENOUS

## 2019-02-25 MED ORDER — VANCOMYCIN HCL IN DEXTROSE 1-5 GM/200ML-% IV SOLN
1000.0000 mg | INTRAVENOUS | Status: AC
  Administered 2019-02-25: 1000 mg via INTRAVENOUS

## 2019-02-25 MED ORDER — ONDANSETRON HCL 4 MG/2ML IJ SOLN: 4.0000 mg | Freq: Four times a day (QID) | INTRAMUSCULAR | Status: DC | PRN

## 2019-02-25 MED ORDER — BUPIVACAINE HCL (PF) 0.25 % IJ SOLN
INTRAMUSCULAR | Status: AC
  Filled 2019-02-25: qty 30

## 2019-02-25 MED ORDER — ROCURONIUM BROMIDE 10 MG/ML (PF) SYRINGE
PREFILLED_SYRINGE | INTRAVENOUS | Status: DC | PRN
  Administered 2019-02-25: 60 mg via INTRAVENOUS
  Administered 2019-02-25 (×3): 20 mg via INTRAVENOUS

## 2019-02-25 MED ORDER — POLYETHYLENE GLYCOL 3350 17 G PO PACK: 17.0000 g | PACK | Freq: Every day | ORAL | Status: DC | PRN

## 2019-02-25 MED ORDER — BUPIVACAINE HCL (PF) 0.25 % IJ SOLN: INTRAMUSCULAR | Status: DC | PRN

## 2019-02-25 MED ORDER — PHENOL 1.4 % MT LIQD: 1.0000 | OROMUCOSAL | Status: DC | PRN

## 2019-02-25 MED ORDER — THROMBIN 20000 UNITS EX SOLR
CUTANEOUS | Status: DC | PRN
  Administered 2019-02-25: 20 mL

## 2019-02-25 MED ORDER — ONDANSETRON HCL 4 MG/2ML IJ SOLN
INTRAMUSCULAR | Status: DC | PRN
  Administered 2019-02-25: 4 mg via INTRAVENOUS

## 2019-02-25 MED ORDER — MIDAZOLAM HCL 2 MG/2ML IJ SOLN
INTRAMUSCULAR | Status: AC
  Filled 2019-02-25: qty 2

## 2019-02-25 SURGICAL SUPPLY — 77 items
BAG DECANTER FOR FLEXI CONT (MISCELLANEOUS) ×2 IMPLANT
BENZOIN TINCTURE PRP APPL 2/3 (GAUZE/BANDAGES/DRESSINGS) ×2 IMPLANT
BIT DRILL 5.0/4.0 (BIT) IMPLANT
BLADE CLIPPER SURG (BLADE) IMPLANT
BLADE SURG 11 STRL SS (BLADE) ×2 IMPLANT
BONE VIVIGEN FORMABLE 5.4CC (Bone Implant) ×2 IMPLANT
BUR CUTTER 7.0 ROUND (BURR) ×2 IMPLANT
BUR MATCHSTICK NEURO 3.0 LAGG (BURR) ×2 IMPLANT
CANISTER SUCT 3000ML PPV (MISCELLANEOUS) ×2 IMPLANT
CAP LOCKING (Cap) ×4 IMPLANT
CAP LOCKING 5.5 CREO (Cap) IMPLANT
CARTRIDGE OIL MAESTRO DRILL (MISCELLANEOUS) ×1 IMPLANT
CONT SPEC 4OZ CLIKSEAL STRL BL (MISCELLANEOUS) ×2 IMPLANT
COVER BACK TABLE 60X90IN (DRAPES) ×2 IMPLANT
DECANTER SPIKE VIAL GLASS SM (MISCELLANEOUS) ×2 IMPLANT
DERMABOND ADVANCED (GAUZE/BANDAGES/DRESSINGS) ×1
DERMABOND ADVANCED .7 DNX12 (GAUZE/BANDAGES/DRESSINGS) ×1 IMPLANT
DIFFUSER DRILL AIR PNEUMATIC (MISCELLANEOUS) ×2 IMPLANT
DRAPE C-ARM 42X72 X-RAY (DRAPES) ×4 IMPLANT
DRAPE C-ARMOR (DRAPES) IMPLANT
DRAPE HALF SHEET 40X57 (DRAPES) IMPLANT
DRAPE LAPAROTOMY 100X72X124 (DRAPES) ×2 IMPLANT
DRAPE SURG 17X23 STRL (DRAPES) ×2 IMPLANT
DRILL 5.0/4.0 (BIT) ×2
DRSG OPSITE 4X5.5 SM (GAUZE/BANDAGES/DRESSINGS) ×2 IMPLANT
DRSG OPSITE POSTOP 4X6 (GAUZE/BANDAGES/DRESSINGS) ×2 IMPLANT
DURAPREP 26ML APPLICATOR (WOUND CARE) ×2 IMPLANT
ELECT BLADE 4.0 EZ CLEAN MEGAD (MISCELLANEOUS) ×2
ELECT REM PT RETURN 9FT ADLT (ELECTROSURGICAL) ×2
ELECTRODE BLDE 4.0 EZ CLN MEGD (MISCELLANEOUS) IMPLANT
ELECTRODE REM PT RTRN 9FT ADLT (ELECTROSURGICAL) ×1 IMPLANT
EVACUATOR 3/16  PVC DRAIN (DRAIN)
EVACUATOR 3/16 PVC DRAIN (DRAIN) IMPLANT
GAUZE 4X4 16PLY RFD (DISPOSABLE) IMPLANT
GAUZE SPONGE 4X4 12PLY STRL (GAUZE/BANDAGES/DRESSINGS) ×2 IMPLANT
GLOVE BIO SURGEON STRL SZ7 (GLOVE) ×1 IMPLANT
GLOVE BIO SURGEON STRL SZ8 (GLOVE) ×4 IMPLANT
GLOVE BIOGEL PI IND STRL 7.0 (GLOVE) IMPLANT
GLOVE BIOGEL PI INDICATOR 7.0 (GLOVE) ×1
GLOVE INDICATOR 8.5 STRL (GLOVE) ×4 IMPLANT
GOWN STRL REUS W/ TWL LRG LVL3 (GOWN DISPOSABLE) IMPLANT
GOWN STRL REUS W/ TWL XL LVL3 (GOWN DISPOSABLE) ×2 IMPLANT
GOWN STRL REUS W/TWL 2XL LVL3 (GOWN DISPOSABLE) IMPLANT
GOWN STRL REUS W/TWL LRG LVL3 (GOWN DISPOSABLE) ×5
GOWN STRL REUS W/TWL XL LVL3 (GOWN DISPOSABLE) ×2
GRAFT BNE MATRIX VG FRMBL MD 5 (Bone Implant) IMPLANT
HEMOSTAT POWDER KIT SURGIFOAM (HEMOSTASIS) IMPLANT
KIT BASIN OR (CUSTOM PROCEDURE TRAY) ×2 IMPLANT
KIT INFUSE XX SMALL 0.7CC (Orthopedic Implant) ×1 IMPLANT
KIT TURNOVER KIT B (KITS) ×2 IMPLANT
MILL MEDIUM DISP (BLADE) ×2 IMPLANT
NDL HYPO 21X1.5 SAFETY (NEEDLE) IMPLANT
NDL HYPO 25X1 1.5 SAFETY (NEEDLE) ×1 IMPLANT
NEEDLE HYPO 21X1.5 SAFETY (NEEDLE) ×2 IMPLANT
NEEDLE HYPO 25X1 1.5 SAFETY (NEEDLE) ×2 IMPLANT
NS IRRIG 1000ML POUR BTL (IV SOLUTION) ×3 IMPLANT
OIL CARTRIDGE MAESTRO DRILL (MISCELLANEOUS) ×2
PACK LAMINECTOMY NEURO (CUSTOM PROCEDURE TRAY) ×2 IMPLANT
PAD ARMBOARD 7.5X6 YLW CONV (MISCELLANEOUS) ×6 IMPLANT
ROD 40MM SPINAL (Rod) ×2 IMPLANT
SCREW 6.5X5.5 30MM (Screw) ×2 IMPLANT
SHAFT CREO 30MM (Neuro Prosthesis/Implant) ×2 IMPLANT
SPACER SUSTAIN TI 8X26X11 8D (Spacer) ×2 IMPLANT
SPONGE LAP 4X18 RFD (DISPOSABLE) IMPLANT
SPONGE SURGIFOAM ABS GEL 100 (HEMOSTASIS) ×2 IMPLANT
STRIP CLOSURE SKIN 1/2X4 (GAUZE/BANDAGES/DRESSINGS) ×3 IMPLANT
SUT VIC AB 0 CT1 18XCR BRD8 (SUTURE) ×2 IMPLANT
SUT VIC AB 0 CT1 8-18 (SUTURE) ×2
SUT VIC AB 2-0 CT1 18 (SUTURE) ×2 IMPLANT
SUT VIC AB 4-0 PS2 18 (SUTURE) ×1 IMPLANT
SUT VIC AB 4-0 PS2 27 (SUTURE) ×2 IMPLANT
SYR 20ML LL LF (SYRINGE) ×1 IMPLANT
TOWEL GREEN STERILE (TOWEL DISPOSABLE) ×2 IMPLANT
TOWEL GREEN STERILE FF (TOWEL DISPOSABLE) ×2 IMPLANT
TRAY FOLEY MTR SLVR 16FR STAT (SET/KITS/TRAYS/PACK) ×2 IMPLANT
TULIP CREP AMP 5.5MM (Orthopedic Implant) ×7 IMPLANT
WATER STERILE IRR 1000ML POUR (IV SOLUTION) ×2 IMPLANT

## 2019-02-25 NOTE — Anesthesia Postprocedure Evaluation (Signed)
Anesthesia Post Note  Patient: Dana Bond  Procedure(s) Performed: Posterior Lumbar Interbody Fusion - Lumbar Five-Sacral One (N/A Back)     Patient location during evaluation: PACU Anesthesia Type: General Level of consciousness: awake and alert Pain management: pain level controlled Vital Signs Assessment: post-procedure vital signs reviewed and stable Respiratory status: spontaneous breathing, nonlabored ventilation, respiratory function stable and patient connected to nasal cannula oxygen Cardiovascular status: blood pressure returned to baseline and stable Postop Assessment: no apparent nausea or vomiting Anesthetic complications: no    Last Vitals:  Vitals:   02/25/19 1711 02/25/19 1943  BP: (!) 112/48 128/71  Pulse: 87 98  Resp: 15 16  Temp: 37 C 37.1 C  SpO2: 98% 97%    Last Pain:  Vitals:   02/25/19 1943  TempSrc: Oral  PainSc:                  Ishaq Maffei COKER

## 2019-02-25 NOTE — Progress Notes (Signed)
Pharmacy Antibiotic Note  Dana Bond is a 53 y.o. female admitted on 02/25/2019 for lumbar surgery.  Pharmacy has been consulted for Vancomycin dosing for 1 dose ~12 hrs post-op, for surgical prophylaxis.  PCN allergy. No drain.  Vancomycin 1 gm IV given pre-op at 11:35am.  Plan:  Vancomycin 1250 mg IV x 1 tonight ~11pm.  No follow up needed.  Pharmacy signing off.  Height: 5\' 8"  (172.7 cm) Weight: 272 lb (123.4 kg) IBW/kg (Calculated) : 63.9  Temp (24hrs), Avg:98.1 F (36.7 C), Min:97.6 F (36.4 C), Max:98.6 F (37 C)  Recent Labs  Lab 02/22/19 1545  WBC 12.1*  CREATININE 0.83    Estimated Creatinine Clearance: 107.3 mL/min (by C-G formula based on SCr of 0.83 mg/dL).    Allergies  Allergen Reactions  . Alendronate Anaphylaxis  . Augmentin [Amoxicillin-Pot Clavulanate] Anaphylaxis    Did it involve swelling of the face/tongue/throat, SOB, or low BP? Yes Did it involve sudden or severe rash/hives, skin peeling, or any reaction on the inside of your mouth or nose? Yes Did you need to seek medical attention at a hospital or doctor's office? Yes When did it last happen?2012 If all above answers are "NO", may proceed with cephalosporin use.   . Ciprofloxacin Anaphylaxis  . Pregabalin Anaphylaxis    Throat swelling    . Adhesive [Tape] Rash    Clear tape  . Other Rash    Clear tape  . Betamethasone Other (See Comments)    unknown  . Doxycycline Other (See Comments)    unknown   . Imuran [Azathioprine] Rash    Antimicrobials this admission:   Vancomycin pre- and post-op on 10/5  Microbiology results:  10/2 MRSA PCR: negative  Thank you for allowing pharmacy to be a part of this patient's care.  Arty Baumgartner, Advance Pager: [phone] or phone: [phone] 02/25/2019 5:29 PM

## 2019-02-25 NOTE — Progress Notes (Signed)
Orthopedic Tech Progress Note Patient Details:  Dana Bond [birth year] [id number] caled in order to [facility] for a [place] Patient ID: Dana Bond, female   DOB: [birth year], 53 y.o.   MRN: [id number]   [name] 02/25/2019, 5:19 PM

## 2019-02-25 NOTE — H&P (Signed)
Dana FishmanLeigh M Bond is an 53 y.o. female.   Chief Complaint: Back and left greater than right leg pain HPI: 53 year old female with severe back left greater than right leg pain MRI scan showed a new recurrent disc herniation L5-S1.  Patient is gotten significantly worse over the last several weeks months felt refractory to all forms of conservative treatment.  She did respond briefly to oral prednisone pack but is significantly disabled right now from severe leg pain and she is now been bedridden for a week.  MRI scan does show large recurrent disc herniation L5-S1 the left relatively normal displaces at the other levels.  I have recommended decompressive laminectomy interbody fusion at L5-S1.  We have extensively gone over the risks and benefits of the procedure with her as well as perioperative course expectations of outcome and alternatives of surgery and she understood and agreed to proceed forward.  Past Medical History:  Diagnosis Date  . Anemia    takes iron  . Anxiety   . Arthritis   . Asthma    mild - no inhaler use in last 3 months  . Costochondritis   . Depression   . DVT (deep venous thrombosis) (HCC) 2012   post op  . Elevated hemoglobin A1c    7.7% 02/22/19  . Family history of anesthesia complication    Mother N/V  . Fibromyalgia   . GERD (gastroesophageal reflux disease)   . H/O hiatal hernia   . Headache(784.0)   . Hemorrhoid   . Hyperlipemia   . Lupus (HCC)   . Mental disorder   . Migraines   . Peripheral vascular disease (HCC)    on trental  . Pleurisy   . Pneumonia    hx    Past Surgical History:  Procedure Laterality Date  . ABDOMINAL HYSTERECTOMY     Partial  . APPENDECTOMY    . BACK SURGERY     lower disectomy  . CARPAL TUNNEL RELEASE Right   . CERVICAL SPINE SURGERY     ACDF x 2  . CESAREAN SECTION  94  . CHOLECYSTECTOMY    . FOOT SURGERY Right    nerve removal  . LUMBAR LAMINECTOMY/DECOMPRESSION MICRODISCECTOMY Right 05/29/2013   Procedure: LUMBAR  LAMINECTOMY/DECOMPRESSION MICRODISCECTOMY 1 LEVEL;  Surgeon: Mariam DollarGary P Daylene Vandenbosch, MD;  Location: [facility];  Service: Neurosurgery;  Laterality: Right;  LUMBAR LAMINECTOMY/DECOMPRESSION MICRODISCECTOMY 1 LEVEL  . LUMBAR LAMINECTOMY/DECOMPRESSION MICRODISCECTOMY Left 02/05/2018   Procedure: Microdiscectomy - Lumbar Five-Sacral One - left;  Surgeon: Donalee Citrinram, Caiya Bettes, MD;  [facility];  Service: Neurosurgery;  Laterality: Left;  Microdiscectomy - Lumbar Five-Sacral One - left  . POSTERIOR CERVICAL FUSION/FORAMINOTOMY N/A 02/10/2014   Procedure: POSTERIOR CERVICAL FUSION/FORAMINOTOMY CERVICAL FIVE-SIX,CERVICAL SIX-,SEVEN;  Surgeon: Mariam DollarGary P Esau Fridman, MD;  Location: [facility];  Service: Neurosurgery;  Laterality: N/A;  . POSTERIOR CERVICAL FUSION/FORAMINOTOMY N/A 04/23/2014   Procedure: Posterior Cervical Laminectomy/Multi Level - C5-C6 - C6-C7 - C7-T1 removal hardware ;  Surgeon: Mariam DollarGary P Anyia Gierke, MD;  [facility];  Service: Neurosurgery;  Laterality: N/A;  . TONSILLECTOMY     and adneodis  . TUBAL LIGATION     30 years ago    History reviewed. No pertinent family history. Social History:  reports that she quit smoking about 11 years ago. Her smoking use included cigarettes. She has a 5.00 pack-year smoking history. She has never used smokeless tobacco. She reports that she does not drink alcohol or use drugs.  Allergies:  Allergies  Allergen Reactions  .  Alendronate Anaphylaxis  . Augmentin [Amoxicillin-Pot Clavulanate] Anaphylaxis  . Ciprofloxacin Anaphylaxis  . Pregabalin Anaphylaxis    Throat swelling    . Adhesive [Tape] Rash    Clear tape  . Other Rash    Clear tape  . Betamethasone     unknown  . Doxycycline     unknown   . Imuran [Azathioprine] Rash    Medications Prior to Admission  Medication Sig Dispense Refill  . atorvastatin (LIPITOR) 20 MG tablet Take 20 mg by mouth daily.    . carisoprodol (SOMA) 350 MG tablet Take 350 mg by mouth 3 (three) times daily as needed for muscle  spasms.    . cholecalciferol (VITAMIN D) 1000 UNITS tablet Take 1,000 Units by mouth daily.    . clonazePAM (KLONOPIN) 0.5 MG tablet Take 0.025-0.5 mg by mouth 3 (three) times daily.    . colchicine 0.6 MG tablet Take 0.6 mg by mouth daily.    . DULoxetine (CYMBALTA) 60 MG capsule Take 60 mg by mouth 2 (two) times daily.    Marland Kitchen esomeprazole (NEXIUM) 40 MG capsule Take 40 mg by mouth 2 (two) times daily before a meal.    . folic acid (FOLVITE) 1 MG tablet Take 1 mg by mouth daily.    Marland Kitchen HYDROcodone-acetaminophen (NORCO/VICODIN) 5-325 MG tablet Take 1 tablet by mouth every 8 (eight) hours as needed.  0  . hydrocortisone (ANUSOL-HC) 25 MG suppository Place 25 mg rectally 2 (two) times daily as needed for hemorrhoids.    Marland Kitchen HYDROmorphone (DILAUDID) 4 MG tablet Take 1 tablet (4 mg total) by mouth every 3 (three) hours as needed for severe pain. (Patient not taking: Reported on 02/01/2018) 60 tablet 0  . hydroxychloroquine (PLAQUENIL) 200 MG tablet Take 400 mg by mouth daily.     Marland Kitchen lubiprostone (AMITIZA) 24 MCG capsule Take 24 mcg by mouth 2 (two) times daily.    . methotrexate (50 MG/ML) 1 G injection Inject 25 mg into the muscle once a week.     . metoprolol succinate (TOPROL-XL) 25 MG 24 hr tablet Take 25 mg by mouth daily.  2  . mupirocin ointment (BACTROBAN) 2 % Place 1 application into the nose 2 (two) times daily. Last dose over week ago    . ondansetron (ZOFRAN) 8 MG tablet Take 8 mg by mouth every 8 (eight) hours as needed for nausea or vomiting.    Marland Kitchen oxyCODONE (ROXICODONE) 15 MG immediate release tablet Take 1 tablet (15 mg total) by mouth every 4 (four) hours as needed for moderate pain or severe pain. (Patient not taking: Reported on 02/01/2018) 80 tablet 0  . Oxycodone HCl (ROXICODONE) 10 MG TABS Take 1 tablet (10 mg total) by mouth every 4 (four) hours as needed for severe pain. (Patient not taking: Reported on 02/01/2018) 80 tablet 0  . pentoxifylline (TRENTAL) 400 MG CR tablet Take 400 mg by  mouth 2 (two) times daily.    . polyethylene glycol powder (GLYCOLAX/MIRALAX) powder Take 1 Container by mouth as needed.    . predniSONE (DELTASONE) 5 MG tablet Take 10 mg by mouth daily with breakfast.     . QUEtiapine (SEROQUEL) 50 MG tablet Take 50 mg by mouth 2 (two) times daily.      No results found for this or any previous visit (from the past 48 hour(s)). No results found.  Review of Systems  Musculoskeletal: Positive for back pain and joint pain.  Neurological: Positive for sensory change and focal weakness.  Height 5\' 8"  (1.727 m), weight 123.4 kg. Physical Exam  Constitutional: She appears well-developed.  HENT:  Head: Normocephalic.  Eyes: Pupils are equal, round, and reactive to light.  Neck: Normal range of motion.  Cardiovascular: Normal rate.  GI: Soft.  Musculoskeletal: Normal range of motion.  Neurological: She is alert. She has normal strength. GCS eye subscore is 4. GCS verbal subscore is 5. GCS motor subscore is 6.  5 out of 5 strength iliopsoas quads hamstrings gastrocs anterior tibialis and EHL.  However this was a very difficult exam patient did not want to move her feet at all with an extensive amount of coaching and repositioning she was able to hold resistance with what I think is full power for brief moment.  I think this reflects mostly pain limited weakness and not true weakness.  Skin: Skin is warm and dry.     Assessment/Plan 53 year old presents for decompressive laminectomy interbody fusion L5-S1.  Deshondra Worst P, MD 02/25/2019, 10:03 AM

## 2019-02-25 NOTE — Progress Notes (Signed)
Received pt from PACU, A&O x4. Lumbar incision with honeycomb dressing dry and intact. Pt with strong and equal strengths to BLE and feet. Denies numbness, no tingling.

## 2019-02-25 NOTE — Transfer of Care (Signed)
Immediate Anesthesia Transfer of Care Note  Patient: Dana Bond  Procedure(s) Performed: Posterior Lumbar Interbody Fusion - Lumbar Five-Sacral One (N/A Back)  Patient Location: PACU  Anesthesia Type:General  Level of Consciousness: awake and alert   Airway & Oxygen Therapy: Patient Spontanous Breathing and Patient connected to nasal cannula oxygen  Post-op Assessment: Report given to RN and Post -op Vital signs reviewed and stable  Post vital signs: Reviewed and stable  Last Vitals:  Vitals Value Taken Time  BP 124/100 02/25/19 1427  Temp    Pulse 82 02/25/19 1430  Resp 11 02/25/19 1430  SpO2 99 % 02/25/19 1430  Vitals shown include unvalidated device data.  Last Pain:  Vitals:   02/25/19 1000  PainSc: 10-Worst pain ever      Patients Stated Pain Goal: 5 (17/51/02 5852)  Complications: No apparent anesthesia complications

## 2019-02-25 NOTE — Op Note (Signed)
Preoperative diagnosis: Herniated nucleus pulposus recurrent L5-S1  Postoperative diagnosis: Same  Procedure: #1 redo decompressive laminectomy L5-S1 with complete medial facetectomies and radical foraminotomies in excess and requiring more work than would be needed with a standard interbody fusion.  2.  Posterior lumbar interbody fusion utilizing the globus titanium insert and rotate titanium cages packed with locally harvested autograft mixed with vivigen and BMP  3.  Cortical screw fixation utilizing the globus Creo amp modular cortical screw set L5-S1  Surgeon: Dominica Severin Shakenya Stoneberg  Assistant: Nash Shearer  Anesthesia: General  EBL: Minimal  HPI: 52 year old female is a progressive worsening back and left greater than right leg pain over the last several weeks months got acutely worse of the last couple weeks.  Patient failed all forms conservative treatment repeat MRI scan showed a large recurrent disc herniation on the left at L5-S1.  Due to patient progression of clinical syndrome imaging findings and failed conservative treatment I recommended decompressive laminectomy and interbody fusion at that level.  I extensively went over the risks and benefits of the operation with her as well as perioperative course expectations of outcome and alternatives of surgery and she understood and agreed to proceed forward.  Operative procedure: Patient was brought into the OR was due to general anesthesia positioned prone Wilson frame her back was prepped and draped in routine sterile fashion her old incision was opened up and extended cephalad scar tissue was dissected free identify the spinous processes and exposed laminotomies she had had bilateral laminotomies in the past with scar tissue and expose the facets and the entry points for the L5 and S1 cortical screws.  Intraoperative x-ray confirmed identification appropriate level.  So the spinous process was removed the facet joints were drilled out with a  high-speed drill central decompression was begun.  Marching superiorly identified both takeoffs of L5 nerve roots performed complete medial facetectomies and unroofed the L5 foramen completely out distal pars.  Then marching inferiorly under biting the superior to collating facet I freed up the scar tissue from the takeoff of the S1 nerve root and gained access to the lateral aspect of the space.  Coagulated the nerve roots there is a large disc herniation left greater than right this was all incised the space was cleaned out radically pituitary rongeurs Epstein curettes and Scovil curettes paddle shavers were used to also prepare the endplates.  I had put in sequential distractors up to a 10.  I selected an 8 x 11 mm insert and rotate 8 degree cage packed with locally harvested autograft mix and after adequate discectomy and endplate preparation inserted 1 cage packed and aggressive amount of autograft mix centrally as long as well as BMP and inserted the contralateral cage.  I packed some additional bone graft laterally and centrally at this time as well.  Fluoroscopy was used each step along the way.  In addition fluoroscopy was used to place the cortical screws which were placed in a normal routine fashion all screws had excellent purchase postop fluoroscopy confirmed good position of the implants is all the foramina reinspected to confirm patency I advanced the screws after assembling the heads placed 40 mm rods and anchored everything in place.  Gelfoam was awake top of the dura Exparel was injected in the fascia and the wound was closed in layers with Vicryl in a running 4 subcuticular Dermabond benzoin Steri-Strips and a sterile dressing was applied patient recovery in stable condition.  At the end the case all needle count  sponge counts were correct.

## 2019-02-25 NOTE — Anesthesia Procedure Notes (Signed)
Procedure Name: Intubation Date/Time: 02/25/2019 11:30 AM Performed by: Wilburn Cornelia, CRNA Pre-anesthesia Checklist: Patient identified, Emergency Drugs available, Suction available, Patient being monitored and Timeout performed Patient Re-evaluated:Patient Re-evaluated prior to induction Oxygen Delivery Method: Circle system utilized Preoxygenation: Pre-oxygenation with 100% oxygen Induction Type: IV induction Ventilation: Mask ventilation without difficulty Laryngoscope Size: Mac and 3 Grade View: Grade I Tube type: Oral Tube size: 7.0 mm Number of attempts: 1 Airway Equipment and Method: Stylet Placement Confirmation: ETT inserted through vocal cords under direct vision,  CO2 detector,  positive ETCO2 and breath sounds checked- equal and bilateral Secured at: 21 cm Tube secured with: Tape Dental Injury: Teeth and Oropharynx as per pre-operative assessment

## 2019-02-26 MED ORDER — OXYCODONE-ACETAMINOPHEN 10-325 MG PO TABS: 1.0000 | ORAL_TABLET | Freq: Four times a day (QID) | ORAL | 0 refills | Status: AC | PRN

## 2019-02-26 MED ORDER — CHLORHEXIDINE GLUCONATE CLOTH 2 % EX PADS
6.0000 | MEDICATED_PAD | Freq: Every day | CUTANEOUS | Status: DC
  Administered 2019-02-26: 6 via TOPICAL

## 2019-02-26 MED ORDER — METHOCARBAMOL 500 MG PO TABS: 500.0000 mg | ORAL_TABLET | Freq: Four times a day (QID) | ORAL | 0 refills | Status: AC

## 2019-02-26 MED ORDER — INFLUENZA VAC SPLIT QUAD 0.5 ML IM SUSY: 0.5000 mL | PREFILLED_SYRINGE | INTRAMUSCULAR | Status: DC

## 2019-02-26 MED ORDER — INFLUENZA VAC SPLIT QUAD 0.5 ML IM SUSY
0.5000 mL | PREFILLED_SYRINGE | INTRAMUSCULAR | Status: AC
  Administered 2019-02-26: 0.5 mL via INTRAMUSCULAR
  Filled 2019-02-26: qty 0.5

## 2019-02-26 MED ORDER — SODIUM CHLORIDE 0.9% FLUSH
10.0000 mL | INTRAVENOUS | Status: DC | PRN
  Administered 2019-02-26: 08:00:00 10 mL

## 2019-02-26 NOTE — Discharge Summary (Signed)
Physician Discharge Summary  Patient ID: Dana Bond MRN: [id number] DOB/AGE: 54/01/1966 54 y.o.  Admit date: 02/25/2019 Discharge date: 02/26/2019  Admission Diagnoses: Herniated nucleus pulposus recurrent L5-S1    Discharge Diagnoses: same   Discharged Condition: good  Hospital Course: The patient was admitted on 02/25/2019 and taken to the operating room where the patient underwent PLIF L5-S1. The patient tolerated the procedure well and was taken to the recovery room and then to the floor in stable condition. The hospital course was routine. There were no complications. The wound remained clean dry and intact. Pt had appropriate back soreness. No complaints of leg pain or new N/T/W. The patient remained afebrile with stable vital signs, and tolerated a regular diet. The patient continued to increase activities, and pain was well controlled with oral pain medications.   Consults: None  Significant Diagnostic Studies:  Results for orders placed or performed during the hospital encounter of 02/25/19  Glucose, capillary  Result Value Ref Range   Glucose-Capillary 139 (H) 70 - 99 mg/dL    Dg Lumbar Spine 2-3 Views  Result Date: 02/25/2019 CLINICAL DATA:  PLIF with L5-S1 EXAM: DG C-ARM 1-60 MIN; LUMBAR SPINE - 2-3 VIEW CONTRAST:  None FLUOROSCOPY TIME:  Fluoroscopy Time:  0 minutes 57 seconds Radiation Exposure Index (if provided by the fluoroscopic device): Not provided Number of Acquired Spot Images: 2 COMPARISON:  Cross-table lateral lumbar radiograph of 02/05/2018 FINDINGS: Prior exam labeled with 5 lumbar vertebra, current study labeled accordingly. BILATERAL pedicle screws are identified at L5-S1. Intervening disc prosthesis at L5-S1 disc space. Bones appear demineralized. IMPRESSION: Intraoperative images during posterior fusion of L5-S1. Electronically Signed   By: Ulyses SouthwardMark  Boles M.D.   On: 02/25/2019 13:57   Dg Chest Port 1 View  Result Date: 02/25/2019 CLINICAL DATA:  Post Left IJ  CVL Insertion for L5-S1 PLIF. EXAM: PORTABLE CHEST 1 VIEW COMPARISON:  Chest x-ray dated 02/03/2014. FINDINGS: LEFT IJ central line in place with tip at the level of the upper SVC. No pneumothorax seen. No pleural effusions seen. Lungs appear clear. Heart size is within normal limits. IMPRESSION: 1. LEFT IJ central line with tip at the level of the upper SVC. No pneumothorax seen. 2. Lungs are clear. Electronically Signed   By: Bary RichardStan  Maynard M.D.   On: 02/25/2019 15:07   Dg C-arm 1-60 Min  Result Date: 02/25/2019 CLINICAL DATA:  PLIF with L5-S1 EXAM: DG C-ARM 1-60 MIN; LUMBAR SPINE - 2-3 VIEW CONTRAST:  None FLUOROSCOPY TIME:  Fluoroscopy Time:  0 minutes 57 seconds Radiation Exposure Index (if provided by the fluoroscopic device): Not provided Number of Acquired Spot Images: 2 COMPARISON:  Cross-table lateral lumbar radiograph of 02/05/2018 FINDINGS: Prior exam labeled with 5 lumbar vertebra, current study labeled accordingly. BILATERAL pedicle screws are identified at L5-S1. Intervening disc prosthesis at L5-S1 disc space. Bones appear demineralized. IMPRESSION: Intraoperative images during posterior fusion of L5-S1. Electronically Signed   By: Ulyses SouthwardMark  Boles M.D.   On: 02/25/2019 13:57    Antibiotics:  Anti-infectives (From admission, onward)   Start     Dose/Rate Route Frequency Ordered Stop   02/26/19 1000  hydroxychloroquine (PLAQUENIL) tablet 400 mg     400 mg Oral Daily 02/25/19 1708     02/25/19 2300  vancomycin (VANCOCIN) 1,250 mg in sodium chloride 0.9 % 250 mL IVPB     1,250 mg 166.7 mL/hr over 90 Minutes Intravenous  Once 02/25/19 1727 02/26/19 0054   02/25/19 1211  bacitracin 50,000 Units in sodium  chloride 0.9 % 500 mL irrigation  Status:  Discontinued       As needed 02/25/19 1211 02/25/19 1423   02/25/19 1000  vancomycin (VANCOCIN) IVPB 1000 mg/200 mL premix     1,000 mg 200 mL/hr over 60 Minutes Intravenous On call to O.R. 02/25/19 0949 02/25/19 1235   02/25/19 0949  vancomycin  (VANCOCIN) 1-5 GM/200ML-% IVPB    Note to Pharmacy: Garen Lah   : cabinet override      02/25/19 0949 02/25/19 1135      Discharge Exam: Blood pressure 114/64, pulse (!) 115, temperature 98.9 F (37.2 C), temperature source Oral, resp. rate 18, height 5\' 8"  (1.727 m), weight 123.4 kg, SpO2 97 %. Neurologic: Grossly normal Ambulating and voiding well  Discharge Medications:   Allergies as of 02/26/2019      Reactions   Alendronate Anaphylaxis   Augmentin [amoxicillin-pot Clavulanate] Anaphylaxis   Did it involve swelling of the face/tongue/throat, SOB, or low BP? Yes Did it involve sudden or severe rash/hives, skin peeling, or any reaction on the inside of your mouth or nose? Yes Did you need to seek medical attention at a hospital or doctor's office? Yes When did it last happen?2012 If all above answers are "NO", may proceed with cephalosporin use.   Ciprofloxacin Anaphylaxis   Pregabalin Anaphylaxis   Throat swelling    Adhesive [tape] Rash   Clear tape   Other Rash   Clear tape   Betamethasone Other (See Comments)   unknown   Doxycycline Other (See Comments)   unknown   Imuran [azathioprine] Rash      Medication List    TAKE these medications   albuterol 108 (90 Base) MCG/ACT inhaler Commonly known as: VENTOLIN HFA Inhale 2 puffs into the lungs every 4 (four) hours as needed for wheezing or shortness of breath.   atorvastatin 20 MG tablet Commonly known as: LIPITOR Take 20 mg by mouth daily.   carisoprodol 350 MG tablet Commonly known as: SOMA Take 350 mg by mouth 3 (three) times daily as needed for muscle spasms.   cholecalciferol 1000 units tablet Commonly known as: VITAMIN D Take 1,000 Units by mouth daily.   clonazePAM 0.5 MG tablet Commonly known as: KLONOPIN Take 0.25-0.5 mg by mouth 3 (three) times daily.   colchicine 0.6 MG tablet Take 0.6 mg by mouth daily.   DULoxetine 60 MG capsule Commonly known as: CYMBALTA Take 60 mg by mouth 2  (two) times daily.   esomeprazole 40 MG capsule Commonly known as: NEXIUM Take 40 mg by mouth 2 (two) times daily before a meal.   folic acid 1 MG tablet Commonly known as: FOLVITE Take 1 mg by mouth daily.   HYDROcodone-acetaminophen 5-325 MG tablet Commonly known as: NORCO/VICODIN Take 1 tablet by mouth every 8 (eight) hours as needed for moderate pain.   hydrocortisone 25 MG suppository Commonly known as: ANUSOL-HC Place 25 mg rectally 2 (two) times daily as needed for hemorrhoids.   hydroxychloroquine 200 MG tablet Commonly known as: PLAQUENIL Take 400 mg by mouth daily.   Linzess 290 MCG Caps capsule Generic drug: linaclotide Take 290 mcg by mouth daily.   methocarbamol 500 MG tablet Commonly known as: Robaxin Take 1 tablet (500 mg total) by mouth 4 (four) times daily.   methotrexate 1 g injection Commonly known as: 50 mg/ml Inject 25 mg into the muscle every Friday.   metoprolol succinate 25 MG 24 hr tablet Commonly known as: TOPROL-XL Take 25 mg by mouth  daily.   ondansetron 8 MG tablet Commonly known as: ZOFRAN Take 8 mg by mouth every 8 (eight) hours as needed for nausea or vomiting.   oxyCODONE-acetaminophen 10-325 MG tablet Commonly known as: Percocet Take 1 tablet by mouth every 6 (six) hours as needed for pain.   pentoxifylline 400 MG CR tablet Commonly known as: TRENTAL Take 400 mg by mouth 2 (two) times daily.   polyethylene glycol 17 g packet Commonly known as: MIRALAX / GLYCOLAX Take 17 g by mouth daily as needed for mild constipation.   predniSONE 5 MG tablet Commonly known as: DELTASONE Take 10 mg by mouth daily with breakfast.   QUEtiapine 50 MG tablet Commonly known as: SEROQUEL Take 50 mg by mouth 2 (two) times daily.       Disposition: home   Final Dx: PLIF L5-S1  Discharge Instructions     Remove dressing in 72 hours   Complete by: As directed    Call MD for:  difficulty breathing, headache or visual disturbances    Complete by: As directed    Call MD for:  hives   Complete by: As directed    Call MD for:  persistant dizziness or light-headedness   Complete by: As directed    Call MD for:  persistant nausea and vomiting   Complete by: As directed    Call MD for:  redness, tenderness, or signs of infection (pain, swelling, redness, odor or green/yellow discharge around incision site)   Complete by: As directed    Call MD for:  severe uncontrolled pain   Complete by: As directed    Call MD for:  temperature >100.4   Complete by: As directed    Diet - low sodium heart healthy   Complete by: As directed    Driving Restrictions   Complete by: As directed    No driving for 2 weeks, no riding in the car for 1 week   Increase activity slowly   Complete by: As directed    Lifting restrictions   Complete by: As directed    No lifting more than 8 lbs         Signed: Ocie Cornfield Dejana Pugsley 02/26/2019, 8:15 AM

## 2019-02-26 NOTE — Social Work (Signed)
CSW acknowledging consult for SNF placement. Will follow for therapy recommendations needed to best determine disposition. Aware at this time that pt has been discharged however will follow for any needs.    Westley Hummer, MSW, [place] Work [phone]

## 2019-02-26 NOTE — Discharge Summary (Signed)
Physician Discharge Summary Patient ID: Dana Bond MRN: [id number] DOB/AGE: Sep 30, 1965 53 y.o. Estimated body mass index is 41.36 kg/m as calculated from the following:   Height as of this encounter: 5\' 8"  (1.727 m).   Weight as of this encounter: 123.4 kg.   Admit date: 02/25/2019 Discharge date: 02/26/2019  Admission Diagnoses:herniated nucleus pulposis L5-S1  Discharge Diagnoses:  Active Problems:   Spinal stenosis of lumbar region   Discharged Condition: good  Hospital Course:  Patient was admitted to the hospital underwent decompressive laminectomy and interbody fusion.  Postoperatively patient was doing very well ambulating and voiding on the floor and stable for discharge home.  Patient will be discharged with scheduled follow-up in 1-2 weeks.  Consults: Significant Diagnostic Studies: Treatments:  Posterior lumbar interbody fusion L5-S1 Discharge Exam: Blood pressure 117/70, pulse 99, temperature 99 F (37.2 C), temperature source Oral, resp. rate 18, height 5\' 8"  (1.727 m), weight 123.4 kg, SpO2 97 %.  strength 5/5 1 clean dry and intact  Disposition:  home  Discharge Instructions    Remove dressing in 72 hours   Complete by: As directed   Call MD for:  difficulty breathing, headache or visual disturbances   Complete by: As directed   Call MD for:  hives   Complete by: As directed   Call MD for:  persistant dizziness or light-headedness   Complete by: As directed   Call MD for:  persistant nausea and vomiting   Complete by: As directed   Call MD for:  redness, tenderness, or signs of infection (pain, swelling, redness, odor or green/yellow discharge around incision site)   Complete by: As directed   Call MD for:  severe uncontrolled pain   Complete by: As directed   Call MD for:  temperature >100.4   Complete by: As directed   Diet - low sodium heart healthy   Complete by: As directed   Driving Restrictions   Complete by: As directed   No driving for 2  weeks, no riding in the car for 1 week  Increase activity slowly   Complete by: As directed   Lifting restrictions   Complete by: As directed   No lifting more than 8 lbs   Allergies as of 02/26/2019     Reactions  Alendronate Anaphylaxis  Augmentin [amoxicillin-pot Clavulanate] Anaphylaxis  Did it involve swelling of the face/tongue/throat, SOB, or low BP? Yes Did it involve sudden or severe rash/hives, skin peeling, or any reaction on the inside of your mouth or nose? Yes Did you need to seek medical attention at a hospital or doctor's office? Yes When did it last happen?2012 If all above answers are "NO", may proceed with cephalosporin use.  Ciprofloxacin Anaphylaxis  Pregabalin Anaphylaxis  Throat swelling   Adhesive [tape] Rash  Clear tape  Other Rash  Clear tape  Betamethasone Other (See Comments)  unknown  Doxycycline Other (See Comments)  unknown  Imuran [azathioprine] Rash   Medication List  TAKE these medications  albuterol 108 (90 Base) MCG/ACT inhaler Commonly known as: VENTOLIN HFA Inhale 2 puffs into the lungs every 4 (four) hours as needed for wheezing or shortness of breath.  atorvastatin 20 MG tablet Commonly known as: LIPITOR Take 20 mg by mouth daily.  carisoprodol 350 MG tablet Commonly known as: SOMA Take 350 mg by mouth 3 (three) times daily as needed for muscle spasms.  cholecalciferol 1000 units tablet Commonly known as: VITAMIN D Take 1,000 Units by mouth daily.  clonazePAM 0.5  MG tablet Commonly known as: KLONOPIN Take 0.25-0.5 mg by mouth 3 (three) times daily.  colchicine 0.6 MG tablet Take 0.6 mg by mouth daily.  DULoxetine 60 MG capsule Commonly known as: CYMBALTA Take 60 mg by mouth 2 (two) times daily.  esomeprazole 40 MG capsule Commonly known as: NEXIUM Take 40 mg by mouth 2 (two) times daily before a meal.  folic acid 1 MG tablet Commonly known as: FOLVITE Take 1 mg by mouth daily.  HYDROcodone-acetaminophen  5-325 MG tablet Commonly known as: NORCO/VICODIN Take 1 tablet by mouth every 8 (eight) hours as needed for moderate pain.  hydrocortisone 25 MG suppository Commonly known as: ANUSOL-HC Place 25 mg rectally 2 (two) times daily as needed for hemorrhoids.  hydroxychloroquine 200 MG tablet Commonly known as: PLAQUENIL Take 400 mg by mouth daily.  Linzess 290 MCG Caps capsule Generic drug: linaclotide Take 290 mcg by mouth daily.  methocarbamol 500 MG tablet Commonly known as: Robaxin Take 1 tablet (500 mg total) by mouth 4 (four) times daily.  methotrexate 1 g injection Commonly known as: 50 mg/ml Inject 25 mg into the muscle every Friday.  metoprolol succinate 25 MG 24 hr tablet Commonly known as: TOPROL-XL Take 25 mg by mouth daily.  ondansetron 8 MG tablet Commonly known as: ZOFRAN Take 8 mg by mouth every 8 (eight) hours as needed for nausea or vomiting.  oxyCODONE-acetaminophen 10-325 MG tablet Commonly known as: Percocet Take 1 tablet by mouth every 6 (six) hours as needed for pain.  pentoxifylline 400 MG CR tablet Commonly known as: TRENTAL Take 400 mg by mouth 2 (two) times daily.  polyethylene glycol 17 g packet Commonly known as: MIRALAX / GLYCOLAX Take 17 g by mouth daily as needed for mild constipation.  predniSONE 5 MG tablet Commonly known as: DELTASONE Take 10 mg by mouth daily with breakfast.  QUEtiapine 50 MG tablet Commonly known as: SEROQUEL Take 50 mg by mouth 2 (two) times daily.      Signed: Zaina Jenkin P 02/26/2019, 3:32 PMed

## 2019-02-26 NOTE — Evaluation (Signed)
Physical Therapy Evaluation Patient Details Name: Dana Bond MRN: [id number] DOB: January 12, 1966 Today's Date: 02/26/2019   History of Present Illness  53 year old female with severe back left greater than right leg pain MRI scan showed a new recurrent disc herniation L5-S1. Pt now s/p L5-S1 decompressive laminectomy with posterior lumbar fusion. PMH significant for anxiety, fibromyalgia, GERD, migraines.  Clinical Impression  Pt demonstrates mild deficits in strength, gait, functional mobility, and balance, but is modI in all mobility at this time. Pt is able to perform all mobility required in the home setting with assistance of a RW. Pt reports she is comfortable with all mobility required at discharge and with brace management. Pt requires no further acute PT services at this time. Acute PT signing off, no PT or DME needed at this time.    Follow Up Recommendations No PT follow up    Equipment Recommendations  None recommended by PT    Recommendations for Other Services       Precautions / Restrictions Precautions Precautions: Back Precaution Booklet Issued: Yes (comment) Required Braces or Orthoses: Spinal Brace Spinal Brace: Lumbar corset(on upon arrival, left on at end of session) Restrictions Weight Bearing Restrictions: No      Mobility  Bed Mobility Overal bed mobility: Modified Independent             General bed mobility comments: sit to sidelying modI with use of rail  Transfers Overall transfer level: Modified independent Equipment used: Rolling walker (2 wheeled)             General transfer comment: sit to stand x3 trials with RW modI  Ambulation/Gait Ambulation/Gait assistance: Modified independent (Device/Increase time) Gait Distance (Feet): 200 Feet Assistive device: Rolling walker (2 wheeled) Gait Pattern/deviations: Step-through pattern Gait velocity: reduced Gait velocity interpretation: <1.8 ft/sec, indicate of risk for recurrent  falls General Gait Details: Reduced gait speed, otherwise unremarkable gait  Stairs            Wheelchair Mobility    Modified Rankin (Stroke Patients Only)       Balance Overall balance assessment: Modified Independent                                           Pertinent Vitals/Pain Pain Assessment: Faces Faces Pain Scale: Hurts a little bit Pain Location: back Pain Descriptors / Indicators: Aching Pain Intervention(s): Limited activity within patient's tolerance    Home Living Family/patient expects to be discharged to:: Private residence Living Arrangements: Children Available Help at Discharge: Family Type of Home: Mobile home Home Access: Ramped entrance     Home Layout: One level Home Equipment: Environmental consultant - 2 wheels;Cane - single point;Shower seat;Grab bars - toilet;Grab bars - tub/shower      Prior Function Level of Independence: Independent with assistive device(s)         Comments: ambulates houshold distances with cane     Hand Dominance   Dominant Hand: Right    Extremity/Trunk Assessment   Upper Extremity Assessment Upper Extremity Assessment: Overall WFL for tasks assessed    Lower Extremity Assessment Lower Extremity Assessment: LLE deficits/detail;RLE deficits/detail RLE Sensation: decreased light touch LLE Deficits / Details: Grossly 4-/5 strength LLE LLE Sensation: WNL    Cervical / Trunk Assessment Cervical / Trunk Assessment: Other exceptions(LSO)  Communication   Communication: No difficulties  Cognition Arousal/Alertness: Awake/alert Behavior During Therapy: WFL for tasks assessed/performed  Overall Cognitive Status: Within Functional Limits for tasks assessed                                        General Comments      Exercises     Assessment/Plan    PT Assessment Patent does not need any further PT services  PT Problem List         PT Treatment Interventions      PT Goals  (Current goals can be found in the Care Plan section)       Frequency     Barriers to discharge        Co-evaluation               AM-PAC PT "6 Clicks" Mobility  Outcome Measure Help needed turning from your back to your side while in a flat bed without using bedrails?: None Help needed moving from lying on your back to sitting on the side of a flat bed without using bedrails?: None Help needed moving to and from a bed to a chair (including a wheelchair)?: None Help needed standing up from a chair using your arms (e.g., wheelchair or bedside chair)?: None Help needed to walk in hospital room?: None Help needed climbing 3-5 steps with a railing? : None 6 Click Score: 24    End of Session Equipment Utilized During Treatment: Gait belt;Back brace Activity Tolerance: Patient tolerated treatment well Patient left: in chair;with call bell/phone within reach Nurse Communication: Mobility status PT Visit Diagnosis: Other abnormalities of gait and mobility (R26.89)    Time: 3810-1751 PT Time Calculation (min) (ACUTE ONLY): 24 min   Charges:     PT Treatments $Gait Training: 8-22 mins        Zenaida Niece, PT, DPT Acute Rehabilitation Pager: [phone]   Zenaida Niece 02/26/2019, 9:30 AM

## 2019-02-26 NOTE — Progress Notes (Addendum)
OT Cancellation Note  Patient Details Name: Dana Bond MRN: [id number] DOB: 12-12-1965   Cancelled Treatment:    Reason Eval/Treat Not Completed: OT screened, no needs identified, will sign off. Per conversation with pt and review of PT note, pt walked 200' mod I this morning. Pt with h/o back surgeries and states she has good recall of ADL back education. Pt is awaiting d/c home today. OT signing off.   Tyrone Schimke, OT Acute Rehabilitation Services Pager: [phone] Office: [phone]  02/26/2019, 11:43 AM
